# Patient Record
Sex: Female | Born: 1960 | Race: White | Hispanic: No | Marital: Married | Smoking: Current every day smoker
Health system: Southern US, Community
[De-identification: ages and names within clinical notes are randomized; demographics above are authoritative.]

## PROBLEM LIST (undated history)

## (undated) DIAGNOSIS — I4891 Unspecified atrial fibrillation: Secondary | ICD-10-CM

## (undated) HISTORY — PX: FOOT SURGERY: SHX648

---

## 2002-12-29 ENCOUNTER — Emergency Department (HOSPITAL_COMMUNITY): Admission: EM | Admit: 2002-12-29 | Discharge: 2002-12-29 | Payer: Self-pay | Admitting: Emergency Medicine

## 2007-02-23 ENCOUNTER — Ambulatory Visit: Payer: Self-pay

## 2007-02-28 ENCOUNTER — Ambulatory Visit: Payer: Self-pay

## 2007-03-06 ENCOUNTER — Ambulatory Visit: Payer: Self-pay

## 2008-10-28 ENCOUNTER — Emergency Department: Payer: Self-pay | Admitting: Emergency Medicine

## 2009-09-26 ENCOUNTER — Emergency Department: Payer: Self-pay | Admitting: Emergency Medicine

## 2010-01-21 ENCOUNTER — Ambulatory Visit: Payer: Self-pay | Admitting: Family Medicine

## 2012-07-10 ENCOUNTER — Emergency Department: Payer: Self-pay | Admitting: Emergency Medicine

## 2013-07-24 ENCOUNTER — Emergency Department: Payer: Self-pay | Admitting: Emergency Medicine

## 2014-12-26 ENCOUNTER — Emergency Department: Payer: Self-pay | Admitting: Student

## 2017-03-06 ENCOUNTER — Emergency Department: Payer: Self-pay

## 2017-03-06 ENCOUNTER — Emergency Department
Admission: EM | Admit: 2017-03-06 | Discharge: 2017-03-06 | Disposition: A | Discharge status: Home / Self Care | Payer: Self-pay | Attending: Emergency Medicine | Admitting: Emergency Medicine

## 2017-03-06 DIAGNOSIS — M79604 Pain in right leg: Secondary | ICD-10-CM | POA: Insufficient documentation

## 2017-03-06 DIAGNOSIS — R252 Cramp and spasm: Secondary | ICD-10-CM

## 2017-03-06 DIAGNOSIS — M79661 Pain in right lower leg: Secondary | ICD-10-CM

## 2017-03-06 LAB — COMPREHENSIVE METABOLIC PANEL
ALT: 16 U/L (ref 14–54)
AST: 26 U/L (ref 15–41)
Albumin: 3.9 g/dL (ref 3.5–5.0)
Alkaline Phosphatase: 79 U/L (ref 38–126)
Anion gap: 7 (ref 5–15)
BILIRUBIN TOTAL: 0.4 mg/dL (ref 0.3–1.2)
BUN: 8 mg/dL (ref 6–20)
CO2: 24 mmol/L (ref 22–32)
CREATININE: 0.64 mg/dL (ref 0.44–1.00)
Calcium: 8.6 mg/dL — ABNORMAL LOW (ref 8.9–10.3)
Chloride: 104 mmol/L (ref 101–111)
Glucose, Bld: 89 mg/dL (ref 65–99)
POTASSIUM: 3.5 mmol/L (ref 3.5–5.1)
Sodium: 135 mmol/L (ref 135–145)
TOTAL PROTEIN: 8.5 g/dL — AB (ref 6.5–8.1)

## 2017-03-06 LAB — CBC
HEMATOCRIT: 44.8 % (ref 35.0–47.0)
Hemoglobin: 15.3 g/dL (ref 12.0–16.0)
MCH: 29.5 pg (ref 26.0–34.0)
MCHC: 34.2 g/dL (ref 32.0–36.0)
MCV: 86.4 fL (ref 80.0–100.0)
Platelets: 311 10*3/uL (ref 150–440)
RBC: 5.18 MIL/uL (ref 3.80–5.20)
RDW: 13.5 % (ref 11.5–14.5)
WBC: 11.1 10*3/uL — ABNORMAL HIGH (ref 3.6–11.0)

## 2017-03-06 MED ORDER — CALCIUM CARBONATE ANTACID 500 MG PO CHEW
1.0000 | CHEWABLE_TABLET | Freq: Once | ORAL | Status: AC
  Administered 2017-03-06: 200 mg via ORAL
  Filled 2017-03-06: qty 1

## 2017-03-06 NOTE — ED Notes (Signed)

## 2017-03-06 NOTE — ED Provider Notes (Addendum)
[facility] Emergency Department Provider Note  ____________________________________________  Time seen: Approximately 10:00 PM  I have reviewed the triage vital signs and the nursing notes.   HISTORY  Chief Complaint Leg Pain    HPI Caroline Rowe is a 56 y.o. female with a history of recurrent lower extremity cramps presenting with right lower extremity cramps. The patient reports that since yesterday, she has had cramps E worse than her usual cramping terms of severity of pain. When she develops the right calf cramp, she walks around or massages it and this seems to help. The cramps are worse if she is sitting for a long time, and she had 2 episodes while sleeping at night. She has no personal or family history of blood clots, and has not had chest pain or shortness of breath. No trauma or fevers.   No past medical history on file.  There are no active problems to display for this patient.   No past surgical history on file.    Allergies Patient has no known allergies.  No family history on file.  Social History Social History  Substance Use Topics  . Smoking status: Not on file  . Smokeless tobacco: Not on file  . Alcohol use Not on file    Review of Systems Constitutional: No fever/chills. Eyes: No visual changes. ENT: No sore throat. No congestion or rhinorrhea. Cardiovascular: Denies chest pain. Denies palpitations. Respiratory: Denies shortness of breath.  No cough. Gastrointestinal:  No nausea, no vomiting.  No diarrhea.  No constipation. Musculoskeletal: Negative for back pain.Positive right lower extremity calf cramps. Skin: Negative for rash. Neurological: Negative for headaches. No focal numbness, tingling or weakness.   10-point ROS otherwise negative.  ____________________________________________   PHYSICAL EXAM:  VITAL SIGNS: ED Triage Vitals  Enc Vitals Group     BP 03/06/17 1935 138/78     Pulse Rate 03/06/17  1935 82     Resp 03/06/17 1935 20     Temp 03/06/17 1935 98.7 F (37.1 C)     Temp Source 03/06/17 1935 Oral     SpO2 03/06/17 1935 100 %     Weight 03/06/17 1935 300 lb (136.1 kg)     Height 03/06/17 1935 5\' 4"  (1.626 m)     Head Circumference --      Peak Flow --      Pain Score 03/06/17 1936 4     Pain Loc --      Pain Edu? --      Excl. in GC? --     Constitutional: Alert and oriented. Well appearing and in no acute distress. Answers questions appropriately. Eyes: Conjunctivae are normal.  EOMI. No scleral icterus. Head: Atraumatic. Nose: No congestion/rhinnorhea. Mouth/Throat: Mucous membranes are moist.  Neck: No stridor.  Supple.   Cardiovascular: Normal rate. Respiratory: Normal respiratory effort.  No accessory muscle use or retractions. Gastrointestinal: Obese. Soft, nontender and nondistended.  No guarding or rebound.  No peritoneal signs. Musculoskeletal: Morbidly obese legs bilaterally w/o pitting edema.  No palpable cords.  Neg Homan's sign.  No skin changes or evidence of trauma.  No active cramping on my examination. 5 out of 5 in quadriceps and hamstring strength, dorsiflexion and plantarflexion strength. Normal DP and PT pulse on the right. Normal sensation to light touch throughout the right lower extremity. Neurologic:  A&Ox3.  Speech is clear.  Face and smile are symmetric.  EOMI.  Moves all extremities well. Normal gait w/o ataxia. Skin:  Skin  is warm, dry and intact. No rash noted. Psychiatric: Mood and affect are normal. Speech and behavior are normal.  Normal judgement.  ____________________________________________   LABS (all labs ordered are listed, but only abnormal results are displayed)  Labs Reviewed  CBC - Abnormal; Notable for the following:       Result Value   WBC 11.1 (*)    All other components within normal limits  COMPREHENSIVE METABOLIC PANEL - Abnormal; Notable for the following:    Calcium 8.6 (*)    Total Protein 8.5 (*)    All  other components within normal limits   ____________________________________________  EKG  Not indicated ____________________________________________  RADIOLOGY  No results found.  ____________________________________________   PROCEDURES  Procedure(s) performed: None  Procedures  Critical Care performed: No ____________________________________________   INITIAL IMPRESSION / ASSESSMENT AND PLAN / ED COURSE  Pertinent labs & imaging results that were available during my care of the patient were reviewed by me and considered in my medical decision making (see chart for details).  56 y.o. female with a history of leg cramps presenting with worsened leg cramps. The patient's laboratory show normal potassium and sodium, and minimally decreased calcium at 8.6. I will supplement her calcium orally with Tums to see if this can help the patient's symptoms. I'm awaiting the results of her ultrasound for final disposition.  ----------------------------------------- 10:21 PM on 03/06/2017 -----------------------------------------  The patient's ultrasound shows an area that is hyperechoic in the right calf where there may be fluid. This could be a hematoma, and the radiologist states that this could be an infection. However, the patient is not having any infectious symptoms, and her white blood cell count is 11.1 which is nonspecific. I have personally reexamined the patient will she is standing and push on the calf, which is tender but has no palpable fluctuance or evidence of overlying skin infection. I have told the patient the results of her studies, and asked her to please monitor her calf or have her family members look at her calf twice daily, and follow up with her primary care physician in 2 days. She and her sister understand return precautions as well as follow-up instructions.  ____________________________________________  FINAL CLINICAL IMPRESSION(S) / ED DIAGNOSES  Final  diagnoses:  Leg cramps  Hypocalcemia         NEW MEDICATIONS STARTED DURING THIS VISIT:  New Prescriptions   No medications on file      Rockne MenghiniAnne-Caroline Tommy Minichiello, MD 03/06/17 2204    Rockne MenghiniAnne-Caroline Jolanta Cabeza, MD 03/06/17 2222

## 2017-03-06 NOTE — Discharge Instructions (Signed)
Please make a follow up with your primary care doctor to have a recheck of your calf, and to recheck your calcium level.  Return to the emergency department if you develop worsening pain, fever, vomiting, or any other symptoms concerning to you.

## 2017-03-06 NOTE — ED Notes (Signed)
Pt. States leg cramp to rt. Leg that started two days ago.  Pt. States hx of leg cramps to lt. Leg.  Pt. States leg cramp usually does not last this long.  Pt. Denies hx of clots.

## 2017-03-06 NOTE — ED Triage Notes (Signed)
Pt states that she frequently gets leg cramps, states that she has had a severe cramp to the rt lower leg and reports the pain continues to linger and radiate throughout her leg. Pt denies any known history of electrolyte issues, also denies hx of blood clots

## 2018-09-30 ENCOUNTER — Encounter: Payer: Self-pay | Admitting: Emergency Medicine

## 2018-09-30 ENCOUNTER — Inpatient Hospital Stay
Admission: EM | Admit: 2018-09-30 | Discharge: 2018-10-04 | DRG: 191 | Disposition: A | Discharge status: Home / Self Care | Payer: Self-pay | Attending: Internal Medicine | Admitting: Internal Medicine

## 2018-09-30 ENCOUNTER — Other Ambulatory Visit: Payer: Self-pay

## 2018-09-30 ENCOUNTER — Emergency Department: Payer: Self-pay

## 2018-09-30 DIAGNOSIS — J441 Chronic obstructive pulmonary disease with (acute) exacerbation: Principal | ICD-10-CM | POA: Diagnosis present

## 2018-09-30 DIAGNOSIS — E669 Obesity, unspecified: Secondary | ICD-10-CM | POA: Diagnosis present

## 2018-09-30 DIAGNOSIS — R001 Bradycardia, unspecified: Secondary | ICD-10-CM | POA: Diagnosis not present

## 2018-09-30 DIAGNOSIS — R002 Palpitations: Secondary | ICD-10-CM | POA: Diagnosis present

## 2018-09-30 DIAGNOSIS — I4891 Unspecified atrial fibrillation: Secondary | ICD-10-CM | POA: Diagnosis present

## 2018-09-30 DIAGNOSIS — J209 Acute bronchitis, unspecified: Secondary | ICD-10-CM | POA: Diagnosis present

## 2018-09-30 DIAGNOSIS — J440 Chronic obstructive pulmonary disease with acute lower respiratory infection: Secondary | ICD-10-CM | POA: Diagnosis present

## 2018-09-30 DIAGNOSIS — Z6841 Body Mass Index (BMI) 40.0 and over, adult: Secondary | ICD-10-CM

## 2018-09-30 DIAGNOSIS — I5022 Chronic systolic (congestive) heart failure: Secondary | ICD-10-CM | POA: Diagnosis present

## 2018-09-30 DIAGNOSIS — E119 Type 2 diabetes mellitus without complications: Secondary | ICD-10-CM | POA: Diagnosis present

## 2018-09-30 DIAGNOSIS — Z8249 Family history of ischemic heart disease and other diseases of the circulatory system: Secondary | ICD-10-CM

## 2018-09-30 DIAGNOSIS — F17200 Nicotine dependence, unspecified, uncomplicated: Secondary | ICD-10-CM | POA: Diagnosis present

## 2018-09-30 LAB — CBC WITH DIFFERENTIAL/PLATELET
ABS IMMATURE GRANULOCYTES: 0.02 10*3/uL (ref 0.00–0.07)
Basophils Absolute: 0 10*3/uL (ref 0.0–0.1)
Basophils Relative: 1 %
Eosinophils Absolute: 0.1 10*3/uL (ref 0.0–0.5)
Eosinophils Relative: 1 %
HEMATOCRIT: 46.1 % — AB (ref 36.0–46.0)
Hemoglobin: 14.8 g/dL (ref 12.0–15.0)
IMMATURE GRANULOCYTES: 0 %
LYMPHS ABS: 2.1 10*3/uL (ref 0.7–4.0)
Lymphocytes Relative: 23 %
MCH: 29.4 pg (ref 26.0–34.0)
MCHC: 32.1 g/dL (ref 30.0–36.0)
MCV: 91.5 fL (ref 80.0–100.0)
MONO ABS: 0.6 10*3/uL (ref 0.1–1.0)
MONOS PCT: 7 %
NEUTROS ABS: 6 10*3/uL (ref 1.7–7.7)
NEUTROS PCT: 68 %
Platelets: 277 10*3/uL (ref 150–400)
RBC: 5.04 MIL/uL (ref 3.87–5.11)
RDW: 13.8 % (ref 11.5–15.5)
WBC: 8.8 10*3/uL (ref 4.0–10.5)
nRBC: 0 % (ref 0.0–0.2)

## 2018-09-30 LAB — COMPREHENSIVE METABOLIC PANEL
ALK PHOS: 69 U/L (ref 38–126)
ALT: 14 U/L (ref 0–44)
AST: 21 U/L (ref 15–41)
Albumin: 3.6 g/dL (ref 3.5–5.0)
Anion gap: 10 (ref 5–15)
BILIRUBIN TOTAL: 1.1 mg/dL (ref 0.3–1.2)
BUN: 9 mg/dL (ref 6–20)
CALCIUM: 8.8 mg/dL — AB (ref 8.9–10.3)
CO2: 25 mmol/L (ref 22–32)
CREATININE: 0.73 mg/dL (ref 0.44–1.00)
Chloride: 105 mmol/L (ref 98–111)
GFR calc Af Amer: >60 mL/min (ref >60)
Glucose, Bld: 125 mg/dL — ABNORMAL HIGH (ref 70–99)
Potassium: 4.4 mmol/L (ref 3.5–5.1)
Sodium: 140 mmol/L (ref 135–145)
TOTAL PROTEIN: 7.8 g/dL (ref 6.5–8.1)

## 2018-09-30 LAB — BRAIN NATRIURETIC PEPTIDE: B Natriuretic Peptide: 525 pg/mL — ABNORMAL HIGH (ref 0.0–100.0)

## 2018-09-30 LAB — TROPONIN I: Troponin I: <0.03 ng/mL (ref <0.03)

## 2018-09-30 MED ORDER — ACETAMINOPHEN 325 MG PO TABS: 650.0000 mg | ORAL_TABLET | ORAL | Status: DC | PRN

## 2018-09-30 MED ORDER — AMOXICILLIN-POT CLAVULANATE 875-125 MG PO TABS
1.0000 | ORAL_TABLET | Freq: Two times a day (BID) | ORAL | Status: DC
  Administered 2018-09-30 – 2018-10-04 (×9): 1 via ORAL
  Filled 2018-09-30 (×9): qty 1

## 2018-09-30 MED ORDER — METHYLPREDNISOLONE SODIUM SUCC 125 MG IJ SOLR
125.0000 mg | Freq: Once | INTRAMUSCULAR | Status: AC
  Administered 2018-09-30: 125 mg via INTRAVENOUS

## 2018-09-30 MED ORDER — MAGNESIUM SULFATE 2 GM/50ML IV SOLN
INTRAVENOUS | Status: AC
  Filled 2018-09-30: qty 50

## 2018-09-30 MED ORDER — DILTIAZEM HCL-DEXTROSE 100-5 MG/100ML-% IV SOLN (PREMIX)
5.0000 mg/h | Freq: Once | INTRAVENOUS | Status: DC
  Filled 2018-09-30: qty 100

## 2018-09-30 MED ORDER — ENOXAPARIN SODIUM 120 MG/0.8ML ~~LOC~~ SOLN: 120.0000 mg | Freq: Two times a day (BID) | SUBCUTANEOUS | Status: DC

## 2018-09-30 MED ORDER — FUROSEMIDE 10 MG/ML IJ SOLN
40.0000 mg | Freq: Once | INTRAMUSCULAR | Status: AC
  Administered 2018-09-30: 40 mg via INTRAVENOUS
  Filled 2018-09-30: qty 4

## 2018-09-30 MED ORDER — ENOXAPARIN SODIUM 120 MG/0.8ML ~~LOC~~ SOLN
120.0000 mg | Freq: Two times a day (BID) | SUBCUTANEOUS | Status: DC
  Administered 2018-09-30 – 2018-10-02 (×4): 120 mg via SUBCUTANEOUS
  Filled 2018-09-30 (×6): qty 0.8

## 2018-09-30 MED ORDER — NICOTINE 21 MG/24HR TD PT24
21.0000 mg | MEDICATED_PATCH | Freq: Every day | TRANSDERMAL | Status: DC
  Administered 2018-09-30 – 2018-10-04 (×5): 21 mg via TRANSDERMAL
  Filled 2018-09-30 (×5): qty 1

## 2018-09-30 MED ORDER — SODIUM CHLORIDE 0.9% FLUSH: 3.0000 mL | INTRAVENOUS | Status: DC | PRN

## 2018-09-30 MED ORDER — ALBUTEROL SULFATE (2.5 MG/3ML) 0.083% IN NEBU
5.0000 mg | INHALATION_SOLUTION | Freq: Once | RESPIRATORY_TRACT | Status: AC
  Administered 2018-09-30: 5 mg via RESPIRATORY_TRACT
  Filled 2018-09-30: qty 6

## 2018-09-30 MED ORDER — IPRATROPIUM-ALBUTEROL 0.5-2.5 (3) MG/3ML IN SOLN
3.0000 mL | Freq: Once | RESPIRATORY_TRACT | Status: AC
  Administered 2018-09-30: 3 mL via RESPIRATORY_TRACT

## 2018-09-30 MED ORDER — ONDANSETRON HCL 4 MG/2ML IJ SOLN: 4.0000 mg | Freq: Four times a day (QID) | INTRAMUSCULAR | Status: DC | PRN

## 2018-09-30 MED ORDER — DILTIAZEM HCL-DEXTROSE 100-5 MG/100ML-% IV SOLN (PREMIX)
5.0000 mg/h | INTRAVENOUS | Status: DC
  Administered 2018-09-30 – 2018-10-01 (×3): 5 mg/h via INTRAVENOUS
  Filled 2018-09-30 (×2): qty 100

## 2018-09-30 MED ORDER — IPRATROPIUM-ALBUTEROL 0.5-2.5 (3) MG/3ML IN SOLN
RESPIRATORY_TRACT | Status: AC
  Filled 2018-09-30: qty 6

## 2018-09-30 MED ORDER — SODIUM CHLORIDE 0.9 % IV BOLUS
500.0000 mL | Freq: Once | INTRAVENOUS | Status: AC
  Administered 2018-09-30: 500 mL via INTRAVENOUS

## 2018-09-30 MED ORDER — METHYLPREDNISOLONE SODIUM SUCC 125 MG IJ SOLR
60.0000 mg | Freq: Four times a day (QID) | INTRAMUSCULAR | Status: DC
  Administered 2018-09-30 – 2018-10-04 (×15): 60 mg via INTRAVENOUS
  Filled 2018-09-30 (×15): qty 2

## 2018-09-30 MED ORDER — METHYLPREDNISOLONE SODIUM SUCC 125 MG IJ SOLR
INTRAMUSCULAR | Status: AC
  Administered 2018-09-30: 125 mg
  Filled 2018-09-30: qty 2

## 2018-09-30 MED ORDER — MAGNESIUM SULFATE 2 GM/50ML IV SOLN
2.0000 g | Freq: Once | INTRAVENOUS | Status: AC
  Administered 2018-09-30: 2 g via INTRAVENOUS

## 2018-09-30 MED ORDER — DILTIAZEM HCL 60 MG PO TABS
60.0000 mg | ORAL_TABLET | Freq: Once | ORAL | Status: AC
  Administered 2018-09-30: 60 mg via ORAL
  Filled 2018-09-30: qty 1

## 2018-09-30 MED ORDER — IPRATROPIUM-ALBUTEROL 0.5-2.5 (3) MG/3ML IN SOLN
3.0000 mL | Freq: Four times a day (QID) | RESPIRATORY_TRACT | Status: DC
  Administered 2018-09-30 – 2018-10-04 (×15): 3 mL via RESPIRATORY_TRACT
  Filled 2018-09-30 (×18): qty 3

## 2018-09-30 MED ORDER — ASPIRIN EC 81 MG PO TBEC
81.0000 mg | DELAYED_RELEASE_TABLET | Freq: Every day | ORAL | Status: DC
  Administered 2018-09-30 – 2018-10-04 (×5): 81 mg via ORAL
  Filled 2018-09-30 (×5): qty 1

## 2018-09-30 MED ORDER — SODIUM CHLORIDE 0.9% FLUSH
3.0000 mL | Freq: Two times a day (BID) | INTRAVENOUS | Status: DC
  Administered 2018-09-30 – 2018-10-04 (×7): 3 mL via INTRAVENOUS

## 2018-09-30 MED ORDER — GUAIFENESIN-DM 100-10 MG/5ML PO SYRP
5.0000 mL | ORAL_SOLUTION | ORAL | Status: DC | PRN
  Administered 2018-09-30: 5 mL via ORAL
  Filled 2018-09-30: qty 5

## 2018-09-30 MED ORDER — DILTIAZEM HCL 25 MG/5ML IV SOLN
15.0000 mg | Freq: Once | INTRAVENOUS | Status: AC
  Administered 2018-09-30: 15 mg via INTRAVENOUS
  Filled 2018-09-30: qty 5

## 2018-09-30 MED ORDER — SODIUM CHLORIDE 0.9 % IV SOLN
250.0000 mL | INTRAVENOUS | Status: DC | PRN
  Administered 2018-10-03: 12:00:00 via INTRAVENOUS

## 2018-09-30 NOTE — ED Notes (Signed)
Pt ambulatory to toilet with steady gait noted. No assistance.

## 2018-09-30 NOTE — ED Triage Notes (Signed)
Here for [facility]. Has had cough X 6 months and been treated for bronchitis in past.  Last week has had increased WOB. Denies COPD but is smoker.  Pursed lip breathing in triage noted.  + cough. Subjective fever

## 2018-09-30 NOTE — Progress Notes (Addendum)
ANTICOAGULATION CONSULT NOTE  Pharmacy Consult for enoxaparin Indication: atrial fibrillation  No Known Allergies  Patient Measurements: Height: 5\' 6"  (167.6 cm) Weight: 300 lb (136.1 kg) IBW/kg (Calculated) : 59.3   Vital Signs: Temp: 98.8 F (37.1 C) (10/19 0902) BP: 129/93 (10/19 1130) Pulse Rate: 100 (10/19 1148)  Labs: Recent Labs    09/30/18 0921  HGB 14.8  HCT 46.1*  PLT 277  CREATININE 0.73  TROPONINI <0.03    Estimated Creatinine Clearance: 110.2 mL/min (by C-G formula based on SCr of 0.73 mg/dL).   Medical History: History reviewed. No pertinent past medical history.   Assessment: 57 year old female with new onset atrial fibrillation. Pharmacy consulted for dosing of enoxaparin. Patient on no anticoagulation PTA.  Goal of Therapy:  Monitor platelets by anticoagulation protocol: Yes   Plan:  Will start enoxaparin 1 mg/kg twice daily. Will round to closest available dose of 120 mg. Enoxaparin 120 mg twice daily to start today at 1400.  Pricilla Riffle, PharmD Pharmacy Resident  09/30/2018 12:06 PM

## 2018-09-30 NOTE — Progress Notes (Signed)
Advanced care plan.  Purpose of the Encounter: CODE STATUS  Parties in Attendance: Patient  Patient's Decision Capacity: Good  Subjective/Patient's story: Presented to emergency room for cough, shortness of breath and wheezing   Objective/Medical story Patient has COPD new onset and bronchitis Also has atrial fibrillation with rapid rate Needs nebulization treatments and IV Cardizem drip for rate control   Goals of care determination:  Advance care directives and goals of care discussed Patient wants everything done which includes CPR, intubation ventilator if the need arises   CODE STATUS: Full code   Time spent discussing advanced care planning: 16 minutes

## 2018-09-30 NOTE — ED Provider Notes (Signed)
[facility] Emergency Department Provider Note    None    (approximate)  I have reviewed the triage vital signs and the nursing notes.   HISTORY  Chief Complaint Shortness of Breath    HPI Caroline Rowe is a 57 y.o. female history of smoking presents with cough for 6 months previously treated for bronchitis without any improvement presents the ER with worsening shortness of breath and exertional dyspnea.  Denies any pain but is having clear productive phlegm.  Denies any fevers.  Does feel short of breath even at rest.  Denies any lower extremity swelling.  No history of DVT or PE.  Denies any history of heart disease or dysrhythmia.  Does have a family history of heart disease.  Denies any nausea or vomiting.    History reviewed. No pertinent past medical history. Family History  Problem Relation Age of Onset  . Heart disease Father    Past Surgical History:  Procedure Laterality Date  . FOOT SURGERY     Patient Active Problem List   Diagnosis Date Noted  . A-fib (HCC) 09/30/2018      Prior to Admission medications   Medication Sig Start Date End Date Taking? Authorizing Provider  ibuprofen (ADVIL,MOTRIN) 200 MG tablet Take 400-800 mg by mouth every 6 (six) hours as needed for mild pain.   Yes [provider]    Allergies Patient has no known allergies.    Social History Social History   Tobacco Use  . Smoking status: Current Every Day Smoker  . Smokeless tobacco: Never Used  Substance Use Topics  . Alcohol use: Never    Frequency: Never  . Drug use: Never    Review of Systems Patient denies headaches, rhinorrhea, blurry vision, numbness, shortness of breath, chest pain, edema, cough, abdominal pain, nausea, vomiting, diarrhea, dysuria, fevers, rashes or hallucinations unless otherwise stated above in HPI. ____________________________________________   PHYSICAL EXAM:  VITAL SIGNS: Vitals:   09/30/18 1148 09/30/18  1321  BP:    Pulse: 100   Resp: (!) 21 15  Temp:    SpO2: 93%     Constitutional: Alert and oriented.  Eyes: Conjunctivae are normal.  Head: Atraumatic. Nose: No congestion/rhinnorhea. Mouth/Throat: Mucous membranes are moist.   Neck: No stridor. Painless ROM.  Cardiovascular: tachycardic, irregularly, irregular rhythm. Grossly normal heart sounds.  Good peripheral circulation. Respiratory: tachypnea, prolonged expiratory phase, diffuse rhonchi in anterior lungs fields and coarse wheeze Gastrointestinal: Soft and nontender. No distention. No abdominal bruits. No CVA tenderness. Genitourinary:  Musculoskeletal: No lower extremity tenderness nor edema.  No joint effusions. Neurologic:  Normal speech and language. No gross focal neurologic deficits are appreciated. No facial droop Skin:  Skin is warm, dry and intact. No rash noted. Psychiatric: Mood and affect are normal. Speech and behavior are normal.  ____________________________________________   LABS (all labs ordered are listed, but only abnormal results are displayed)  Results for orders placed or performed during the hospital encounter of 09/30/18 (from the past 24 hour(s))  CBC with Differential/Platelet     Status: Abnormal   Collection Time: 09/30/18  9:21 AM  Result Value Ref Range   WBC 8.8 4.0 - 10.5 K/uL   RBC 5.04 3.87 - 5.11 MIL/uL   Hemoglobin 14.8 12.0 - 15.0 g/dL   HCT 16.1 (H) 09.6 - 04.5 %   MCV 91.5 80.0 - 100.0 fL   MCH 29.4 26.0 - 34.0 pg   MCHC 32.1 30.0 - 36.0 g/dL  RDW 13.8 11.5 - 15.5 %   Platelets 277 150 - 400 K/uL   nRBC 0.0 0.0 - 0.2 %   Neutrophils Relative % 68 %   Neutro Abs 6.0 1.7 - 7.7 K/uL   Lymphocytes Relative 23 %   Lymphs Abs 2.1 0.7 - 4.0 K/uL   Monocytes Relative 7 %   Monocytes Absolute 0.6 0.1 - 1.0 K/uL   Eosinophils Relative 1 %   Eosinophils Absolute 0.1 0.0 - 0.5 K/uL   Basophils Relative 1 %   Basophils Absolute 0.0 0.0 - 0.1 K/uL   Immature Granulocytes 0 %   Abs  Immature Granulocytes 0.02 0.00 - 0.07 K/uL  Comprehensive metabolic panel     Status: Abnormal   Collection Time: 09/30/18  9:21 AM  Result Value Ref Range   Sodium 140 135 - 145 mmol/L   Potassium 4.4 3.5 - 5.1 mmol/L   Chloride 105 98 - 111 mmol/L   CO2 25 22 - 32 mmol/L   Glucose, Bld 125 (H) 70 - 99 mg/dL   BUN 9 6 - 20 mg/dL   Creatinine, Ser 1.19 0.44 - 1.00 mg/dL   Calcium 8.8 (L) 8.9 - 10.3 mg/dL   Total Protein 7.8 6.5 - 8.1 g/dL   Albumin 3.6 3.5 - 5.0 g/dL   AST 21 15 - 41 U/L   ALT 14 0 - 44 U/L   Alkaline Phosphatase 69 38 - 126 U/L   Total Bilirubin 1.1 0.3 - 1.2 mg/dL   GFR calc non Af Amer >60 >60 mL/min   GFR calc Af Amer >60 >60 mL/min   Anion gap 10 5 - 15  Troponin I     Status: None   Collection Time: 09/30/18  9:21 AM  Result Value Ref Range   Troponin I <0.03 <0.03 ng/mL  Brain natriuretic peptide     Status: Abnormal   Collection Time: 09/30/18  9:21 AM  Result Value Ref Range   B Natriuretic Peptide 525.0 (H) 0.0 - 100.0 pg/mL   ____________________________________________  EKG My review and personal interpretation at Time: 9:02   Indication: sob  Rate: 150  Rhythm: afib with rvr Axis: normal Other: nonspecific st abn, nos temi ____________________________________________  RADIOLOGY  I personally reviewed all radiographic images ordered to evaluate for the above acute complaints and reviewed radiology reports and findings.  These findings were personally discussed with the patient.  Please see medical record for radiology report.  ____________________________________________   PROCEDURES  Procedure(s) performed:  .Critical Care Performed by: Willy Eddy, MD Authorized by: Willy Eddy, MD   Critical care provider statement:    Critical care time (minutes):  43   Critical care time was exclusive of:  Separately billable procedures and treating other patients   Critical care was necessary to treat or prevent imminent or  life-threatening deterioration of the following conditions:  Cardiac failure   Critical care was time spent personally by me on the following activities:  Development of treatment plan with patient or surrogate, discussions with consultants, evaluation of patient's response to treatment, examination of patient, obtaining history from patient or surrogate, ordering and performing treatments and interventions, ordering and review of laboratory studies, ordering and review of radiographic studies, pulse oximetry, re-evaluation of patient's condition and review of old charts      Critical Care performed: yes ____________________________________________   INITIAL IMPRESSION / ASSESSMENT AND PLAN / ED COURSE  Pertinent labs & imaging results that were available during my care of the  patient were reviewed by me and considered in my medical decision making (see chart for details).   DDX: Asthma, copd, CHF, pna, ptx, malignancy, Pe, anemia   Ghislaine C Berghuis is a 57 y.o. who presents to the ED with was as described above.  Patient is tachycardic but normotensive.  Does show evidence of A. fib with RVR.  Presentation is concerning for COPD exacerbation with acute bronchitis versus CHF.  Blood will be sent for the above differential.  Will give small bolus of IV fluids as well as nebulizers, IV steroids as well as IV magnesium and reassess.  Clinical Course as of Sep 30 1341  Sat Sep 30, 2018  1027 Patient with persistent tachycardia.  Chest x-ray does show evidence of bony vascular congestion, elevated BNP concerning for new onset congestive heart failure.  Patient with persistent A. fib with RVR we will give IV Cardizem bolus followed by infusion.  Some improvement in respiration after nebulizer treatment.   [PR]  1107 Still persistent wheeze as well.  Will give additional nebulizer treatments.  Patient will require hospitalization for additional respiratory therapy as well as hemodynamic monitoring  concern for A. fib with RVR and chest x-ray concerning for pulmonary edema as she would benefit from echo and further evaluation.  Have discussed with the patient and available family all diagnostics and treatments performed thus far and all questions were answered to the best of my ability. The patient demonstrates understanding and agreement with plan.    [PR]    Clinical Course User Index [PR] Willy Eddy, MD     As part of my medical decision making, I reviewed the following data within the electronic MEDICAL RECORD NUMBER Nursing notes reviewed and incorporated, Labs reviewed, notes from prior ED visits and [place] Controlled Substance Database   ____________________________________________   FINAL CLINICAL IMPRESSION(S) / ED DIAGNOSES  Final diagnoses:  COPD with acute exacerbation (HCC)  Atrial fibrillation with RVR (HCC)      NEW MEDICATIONS STARTED DURING THIS VISIT:  New Prescriptions   No medications on file     Note:  This document was prepared using Dragon voice recognition software and may include unintentional dictation errors.    Willy Eddy, MD 09/30/18 [phone]

## 2018-09-30 NOTE — H&P (Signed)
[place] [facility] [place] NAME: Caroline Rowe    MR#:  [id number]  DATE OF BIRTH:  14-May-1961  DATE OF ADMISSION:  09/30/2018  PRIMARY CARE PHYSICIAN: Patient, No Pcp Per   REQUESTING/REFERRING PHYSICIAN:   CHIEF COMPLAINT:   Chief Complaint  Patient presents with  . Shortness of Breath    HISTORY OF PRESENT ILLNESS: Caroline Rowe  is a 57 y.o. female with no significant past medical history presented to emergency room for cough and wheezing.  Cough has been going on for the last couple of weeks.  She is an active smoker.  Patient also had some palpitations since 1 day.  She was evaluated in the emergency room was found to be in atrial fibrillation with rapid rate with a rate of 150 bpm.  Patient was started on IV Cardizem drip in the emergency room.  She received a nebulization treatment and Solu-Medrol for COPD flare.  No complaints of any chest pain.  Hospitalist service was consulted for further care.  PAST MEDICAL HISTORY: None  PAST SURGICAL HISTORY: None Past Surgical History:  Procedure Laterality Date  . FOOT SURGERY      SOCIAL HISTORY:  Social History   Tobacco Use  . Smoking status: Current Every Day Smoker  . Smokeless tobacco: Never Used  Substance Use Topics  . Alcohol use: Never    Frequency: Never    FAMILY HISTORY:  Family History  Problem Relation Age of Onset  . Heart disease Father     DRUG ALLERGIES: No Known Allergies  REVIEW OF SYSTEMS:   CONSTITUTIONAL: No fever, fatigue or weakness.  EYES: No blurred or double vision.  EARS, NOSE, AND THROAT: No tinnitus or ear pain.  RESPIRATORY: Has cough, shortness of breath, wheezing  No hemoptysis.  CARDIOVASCULAR: No chest pain, orthopnea, edema.  Palpitations GASTROINTESTINAL: No nausea, vomiting, diarrhea or abdominal pain.  GENITOURINARY: No dysuria, hematuria.  ENDOCRINE: No polyuria, nocturia,  HEMATOLOGY: No anemia, easy bruising or  bleeding SKIN: No rash or lesion. MUSCULOSKELETAL: No joint pain or arthritis.   NEUROLOGIC: No tingling, numbness, weakness.  PSYCHIATRY: No anxiety or depression.   MEDICATIONS AT HOME:  Prior to Admission medications   Medication Sig Start Date End Date Taking? Authorizing Provider  ibuprofen (ADVIL,MOTRIN) 200 MG tablet Take 400-800 mg by mouth every 6 (six) hours as needed for mild pain.   Yes [provider]      PHYSICAL EXAMINATION:   VITAL SIGNS: Blood pressure (!) 124/97, pulse (!) 165, temperature 98.8 F (37.1 C), resp. rate 16, height '5\' 6"'  (1.676 m), weight 136.1 kg, SpO2 97 %.  GENERAL:  57 y.o.-year-old patient lying in the bed with no acute distress.  EYES: Pupils equal, round, reactive to light and accommodation. No scleral icterus. Extraocular muscles intact.  HEENT: Head atraumatic, normocephalic. Oropharynx and nasopharynx clear.  NECK:  Supple, no jugular venous distention. No thyroid enlargement, no tenderness.  LUNGS: Decreased breath sounds bilaterally, bilateral wheezing. No use of accessory muscles of respiration.  CARDIOVASCULAR: S1, S2 irregular. No murmurs, rubs, or gallops.  ABDOMEN: Soft, nontender, nondistended. Bowel sounds present. No organomegaly or mass.  EXTREMITIES: No pedal edema, cyanosis, or clubbing.  NEUROLOGIC: Cranial nerves II through XII are intact. Muscle strength 5/5 in all extremities. Sensation intact. Gait not checked.  PSYCHIATRIC: The patient is alert and oriented x 3.  SKIN: No obvious rash, lesion, or ulcer.   LABORATORY PANEL:   CBC Recent Labs  Lab 09/30/18 0921  WBC 8.8  HGB 14.8  HCT 46.1*  PLT 277  MCV 91.5  MCH 29.4  MCHC 32.1  RDW 13.8  LYMPHSABS 2.1  MONOABS 0.6  EOSABS 0.1  BASOSABS 0.0   ------------------------------------------------------------------------------------------------------------------  Chemistries  Recent Labs  Lab 09/30/18 0921  NA 140  K 4.4  CL 105  CO2 25  GLUCOSE  125*  BUN 9  CREATININE 0.73  CALCIUM 8.8*  AST 21  ALT 14  ALKPHOS 69  BILITOT 1.1   ------------------------------------------------------------------------------------------------------------------ estimated creatinine clearance is 110.2 mL/min (by C-G formula based on SCr of 0.73 mg/dL). ------------------------------------------------------------------------------------------------------------------ No results for input(s): TSH, T4TOTAL, T3FREE, THYROIDAB in the last 72 hours.  Invalid input(s): FREET3   Coagulation profile No results for input(s): INR, PROTIME in the last 168 hours. ------------------------------------------------------------------------------------------------------------------- No results for input(s): DDIMER in the last 72 hours. -------------------------------------------------------------------------------------------------------------------  Cardiac Enzymes Recent Labs  Lab 09/30/18 0921  TROPONINI <0.03   ------------------------------------------------------------------------------------------------------------------ Invalid input(s): POCBNP  ---------------------------------------------------------------------------------------------------------------  Urinalysis No results found for: COLORURINE, APPEARANCEUR, LABSPEC, PHURINE, GLUCOSEU, HGBUR, BILIRUBINUR, KETONESUR, PROTEINUR, UROBILINOGEN, NITRITE, LEUKOCYTESUR   RADIOLOGY: Dg Chest 2 View  Result Date: 09/30/2018 CLINICAL DATA:  Shortness of breath.  Cough. EXAM: CHEST - 2 VIEW COMPARISON:  None. FINDINGS: Central vascular structures are prominent and appear enlarged. Interstitial lung markings are mildly prominent. The trachea is midline. No large pleural effusions. Negative for a pneumothorax. No acute bone abnormality. Heart size is within normal limits. IMPRESSION: Prominent central vascular and interstitial lung markings. Findings are concerning for vascular congestion or mild  pulmonary edema. Electronically Signed   By: Markus Daft M.D.   On: 09/30/2018 10:24    EKG: Orders placed or performed during the hospital encounter of 09/30/18  . EKG 12-Lead  . EKG 12-Lead  . ED EKG  . ED EKG    IMPRESSION AND PLAN:  57 year old female patient with active tobacco abuse presented to the emergency room for cough, wheezing and palpitation  -New onset COPD with flare Start patient on IV Solu-Medrol 60 MDQ 6 hourly with ablation treatments  -Acute bronchitis Oral Augmentin antibiotic  -New onset atrial fibrillation IV Cardizem drip for rate control and switching to oral medication once parameters met Check echocardiogram and cardiology consultation  -DVT prophylaxis On anticoagulation with subcu Lovenox  -Tobacco abuse Tobacco cessation counseled to the patient for 6 minutes Nicotine patch offered  All the records are reviewed and case discussed with ED provider. Management plans discussed with the patient, family and they are in agreement.  CODE STATUS: Full code    TOTAL TIME TAKING CARE OF THIS PATIENT: 51 minutes.    Saundra Shelling M.D on 09/30/2018 at 11:33 AM  Between 7am to 6pm - Pager - [phone]  After 6pm go to [email or web address] - password [email or web address] [facility]  Office  [phone]  CC: Primary care physician; Patient, No Pcp Per

## 2018-10-01 ENCOUNTER — Other Ambulatory Visit: Payer: Self-pay

## 2018-10-01 ENCOUNTER — Inpatient Hospital Stay
Admit: 2018-10-01 | Discharge: 2018-10-01 | Disposition: A | Discharge status: Home / Self Care | Payer: Self-pay | Attending: Cardiovascular Disease | Admitting: Cardiovascular Disease

## 2018-10-01 LAB — ECHOCARDIOGRAM COMPLETE
Height: 66 in
WEIGHTICAEL: 4768 [oz_av]

## 2018-10-01 LAB — TROPONIN I: Troponin I: <0.03 ng/mL (ref <0.03)

## 2018-10-01 MED ORDER — MAGNESIUM HYDROXIDE 400 MG/5ML PO SUSP: 30.0000 mL | Freq: Every day | ORAL | Status: DC | PRN

## 2018-10-01 MED ORDER — AMIODARONE HCL 200 MG PO TABS
400.0000 mg | ORAL_TABLET | Freq: Every day | ORAL | Status: DC
  Administered 2018-10-01: 400 mg via ORAL
  Filled 2018-10-01: qty 2

## 2018-10-01 MED ORDER — AMIODARONE HCL IN DEXTROSE 360-4.14 MG/200ML-% IV SOLN
60.0000 mg/h | INTRAVENOUS | Status: DC
  Administered 2018-10-01: 60 mg/h via INTRAVENOUS
  Filled 2018-10-01: qty 200

## 2018-10-01 MED ORDER — AMIODARONE HCL IN DEXTROSE 360-4.14 MG/200ML-% IV SOLN
30.0000 mg/h | INTRAVENOUS | Status: DC
  Administered 2018-10-02 – 2018-10-04 (×5): 30 mg/h via INTRAVENOUS
  Filled 2018-10-01 (×5): qty 200

## 2018-10-01 NOTE — Progress Notes (Signed)
Remains in afib and will stop po amiodrone and  Iv cardizem and start IV amiodrone as LVEF severely depressed. Will start later entresto/coreg.

## 2018-10-01 NOTE — Progress Notes (Signed)
[facility] [place] [facility]   PATIENT NAME: Caroline Rowe    MR#:  [id number]  DATE OF BIRTH:  09/13/1961  SUBJECTIVE:  CHIEF COMPLAINT:   Chief Complaint  Patient presents with  . Shortness of Breath   Continues to have shortness of breath, wheezing and cough.  Telemetry shows atrial fibrillation  REVIEW OF SYSTEMS:    Review of Systems  Constitutional: Positive for malaise/fatigue. Negative for chills and fever.  HENT: Negative for sore throat.   Eyes: Negative for blurred vision, double vision and pain.  Respiratory: Positive for cough, shortness of breath and wheezing. Negative for hemoptysis.   Cardiovascular: Negative for chest pain, palpitations, orthopnea and leg swelling.  Gastrointestinal: Negative for abdominal pain, constipation, diarrhea, heartburn, nausea and vomiting.  Genitourinary: Negative for dysuria and hematuria.  Musculoskeletal: Negative for back pain and joint pain.  Skin: Negative for rash.  Neurological: Negative for sensory change, speech change, focal weakness and headaches.  Endo/Heme/Allergies: Does not bruise/bleed easily.  Psychiatric/Behavioral: Negative for depression. The patient is not nervous/anxious.     DRUG ALLERGIES:  No Known Allergies  VITALS:  Blood pressure 99/68, pulse 92, temperature 97.7 F (36.5 C), temperature source Oral, resp. rate 18, height 5\' 6"  (1.676 m), weight 135.2 kg, SpO2 96 %.  PHYSICAL EXAMINATION:   Physical Exam  GENERAL:  57 y.o.-year-old patient lying in the bed with conversational dyspnea.  Obese EYES: Pupils equal, round, reactive to light and accommodation. No scleral icterus. Extraocular muscles intact.  HEENT: Head atraumatic, normocephalic. Oropharynx and nasopharynx clear.  NECK:  Supple, no jugular venous distention. No thyroid enlargement, no tenderness.  LUNGS: Increased work of breathing with bilateral wheezing CARDIOVASCULAR: Tachycardic, irregularly  irregular ABDOMEN: Soft, nontender, nondistended. Bowel sounds present. No organomegaly or mass.  EXTREMITIES: No cyanosis, clubbing or edema b/l.    NEUROLOGIC: Cranial nerves II through XII are intact. No focal Motor or sensory deficits b/l.   PSYCHIATRIC: The patient is alert and oriented x 3.  SKIN: No obvious rash, lesion, or ulcer.   LABORATORY PANEL:   CBC Recent Labs  Lab 09/30/18 0921  WBC 8.8  HGB 14.8  HCT 46.1*  PLT 277   ------------------------------------------------------------------------------------------------------------------ Chemistries  Recent Labs  Lab 09/30/18 0921  NA 140  K 4.4  CL 105  CO2 25  GLUCOSE 125*  BUN 9  CREATININE 0.73  CALCIUM 8.8*  AST 21  ALT 14  ALKPHOS 69  BILITOT 1.1   ------------------------------------------------------------------------------------------------------------------  Cardiac Enzymes Recent Labs  Lab 10/01/18 0509  TROPONINI <0.03   ------------------------------------------------------------------------------------------------------------------  RADIOLOGY:  Dg Chest 2 View  Result Date: 09/30/2018 CLINICAL DATA:  Shortness of breath.  Cough. EXAM: CHEST - 2 VIEW COMPARISON:  None. FINDINGS: Central vascular structures are prominent and appear enlarged. Interstitial lung markings are mildly prominent. The trachea is midline. No large pleural effusions. Negative for a pneumothorax. No acute bone abnormality. Heart size is within normal limits. IMPRESSION: Prominent central vascular and interstitial lung markings. Findings are concerning for vascular congestion or mild pulmonary edema. Electronically Signed   By: Richarda Overlie M.D.   On: 09/30/2018 10:24     ASSESSMENT AND PLAN:   57 year old female patient with active tobacco abuse presented to the emergency room for cough, wheezing and palpitation  * Acute bronchitis -IV steroids, Antibiotics - Scheduled Nebulizers - Inhalers -Wean O2 as  tolerated - Consult pulmonary if no improvement  * New onset atrial fibrillation On Cardizem drip.  Appreciate cardiology input.  Added oral amiodarone.  On therapeutic Lovenox.  -DVT prophylaxis On anticoagulation with subcu Lovenox  -Tobacco abuse Tobacco cessation counseled on admission  All the records are reviewed and case discussed with Care Management/Social Worker Management plans discussed with the patient, family and they are in agreement.  CODE STATUS: FULL CODE  DVT Prophylaxis: SCDs  TOTAL TIME TAKING CARE OF THIS PATIENT: 35 minutes.   POSSIBLE D/C IN 1-2 DAYS, DEPENDING ON CLINICAL CONDITION.  Molinda Bailiff Idabelle Mcpeters M.D on 10/01/2018 at 1:54 PM  Between 7am to 6pm - Pager - [phone]  After 6pm go to [email or web address] - password [email or web address]  [facility]  Office  [phone]  CC: Primary care physician; Patient, No Pcp Per  Note: This dictation was prepared with Dragon dictation along with smaller phrase technology. Any transcriptional errors that result from this process are unintentional.

## 2018-10-01 NOTE — Consult Note (Signed)
Caroline Rowe is a 57 y.o. female  [id number]  Primary Cardiologist: Adrian Blackwater Reason for Consultation: Atrial fibrillation  HPI: 57 year old female presented to the hospital with shortness of breath and was found to have atrial fibrillation with rapid ventricular response rate about 150/min.   Review of Systems: No chest pain   History reviewed. No pertinent past medical history.  Medications Prior to Admission  Medication Sig Dispense Refill  . ibuprofen (ADVIL,MOTRIN) 200 MG tablet Take 400-800 mg by mouth every 6 (six) hours as needed for mild pain.       Marland Kitchen amoxicillin-clavulanate  1 tablet Oral Q12H  . aspirin EC  81 mg Oral Daily  . enoxaparin (LOVENOX) injection  120 mg Subcutaneous Q12H  . ipratropium-albuterol  3 mL Nebulization Q6H  . methylPREDNISolone (SOLU-MEDROL) injection  60 mg Intravenous Q6H  . nicotine  21 mg Transdermal Daily  . sodium chloride flush  3 mL Intravenous Q12H    Infusions: . sodium chloride    . diltiazem (CARDIZEM) infusion Stopped (09/30/18 1035)  . diltiazem (CARDIZEM) infusion 5 mg/hr (09/30/18 1318)    No Known Allergies  Social History   Socioeconomic History  . Marital status: Married    Spouse name: Not on file  . Number of children: Not on file  . Years of education: Not on file  . Highest education level: Not on file  Occupational History  . Not on file  Social Needs  . Financial resource strain: Not on file  . Food insecurity:    Worry: Not on file    Inability: Not on file  . Transportation needs:    Medical: Not on file    Non-medical: Not on file  Tobacco Use  . Smoking status: Current Every Day Smoker  . Smokeless tobacco: Never Used  Substance and Sexual Activity  . Alcohol use: Never    Frequency: Never  . Drug use: Never  . Sexual activity: Not on file  Lifestyle  . Physical activity:    Days per week: Not on file    Minutes per session: Not on file  . Stress: Not on file  Relationships  .  Social connections:    Talks on phone: Not on file    Gets together: Not on file    Attends religious service: Not on file    Active member of club or organization: Not on file    Attends meetings of clubs or organizations: Not on file    Relationship status: Not on file  . Intimate partner violence:    Fear of current or ex partner: Not on file    Emotionally abused: Not on file    Physically abused: Not on file    Forced sexual activity: Not on file  Other Topics Concern  . Not on file  Social History Narrative  . Not on file    Family History  Problem Relation Age of Onset  . Heart disease Father     PHYSICAL EXAM: Vitals:   09/30/18 1948 09/30/18 2106  BP: 117/68   Pulse: (!) 103   Resp: (!) 22   Temp: 98.8 F (37.1 C)   SpO2: 96% 97%     Intake/Output Summary (Last 24 hours) at 10/01/2018 0028 Last data filed at 09/30/2018 1019 Gross per 24 hour  Intake 550 ml  Output -  Net 550 ml    General:  Well appearing. No respiratory difficulty HEENT: normal Neck: supple. no JVD. Carotids 2+ bilat;  no bruits. No lymphadenopathy or thryomegaly appreciated. Cor: PMI nondisplaced. Regular rate & rhythm. No rubs, gallops or murmurs. Lungs: clear Abdomen: soft, nontender, nondistended. No hepatosplenomegaly. No bruits or masses. Good bowel sounds. Extremities: no cyanosis, clubbing, rash, edema Neuro: alert & oriented x 3, cranial nerves grossly intact. moves all 4 extremities w/o difficulty. Affect pleasant.  ECG: A. fib with rapid ventricular response rate 150/min with low voltage and nonspecific ST-T changes  Results for orders placed or performed during the hospital encounter of 09/30/18 (from the past 24 hour(s))  CBC with Differential/Platelet     Status: Abnormal   Collection Time: 09/30/18  9:21 AM  Result Value Ref Range   WBC 8.8 4.0 - 10.5 K/uL   RBC 5.04 3.87 - 5.11 MIL/uL   Hemoglobin 14.8 12.0 - 15.0 g/dL   HCT 96.2 (H) 95.2 - 84.1 %   MCV 91.5 80.0  - 100.0 fL   MCH 29.4 26.0 - 34.0 pg   MCHC 32.1 30.0 - 36.0 g/dL   RDW 32.4 40.1 - 02.7 %   Platelets 277 150 - 400 K/uL   nRBC 0.0 0.0 - 0.2 %   Neutrophils Relative % 68 %   Neutro Abs 6.0 1.7 - 7.7 K/uL   Lymphocytes Relative 23 %   Lymphs Abs 2.1 0.7 - 4.0 K/uL   Monocytes Relative 7 %   Monocytes Absolute 0.6 0.1 - 1.0 K/uL   Eosinophils Relative 1 %   Eosinophils Absolute 0.1 0.0 - 0.5 K/uL   Basophils Relative 1 %   Basophils Absolute 0.0 0.0 - 0.1 K/uL   Immature Granulocytes 0 %   Abs Immature Granulocytes 0.02 0.00 - 0.07 K/uL  Comprehensive metabolic panel     Status: Abnormal   Collection Time: 09/30/18  9:21 AM  Result Value Ref Range   Sodium 140 135 - 145 mmol/L   Potassium 4.4 3.5 - 5.1 mmol/L   Chloride 105 98 - 111 mmol/L   CO2 25 22 - 32 mmol/L   Glucose, Bld 125 (H) 70 - 99 mg/dL   BUN 9 6 - 20 mg/dL   Creatinine, Ser 2.53 0.44 - 1.00 mg/dL   Calcium 8.8 (L) 8.9 - 10.3 mg/dL   Total Protein 7.8 6.5 - 8.1 g/dL   Albumin 3.6 3.5 - 5.0 g/dL   AST 21 15 - 41 U/L   ALT 14 0 - 44 U/L   Alkaline Phosphatase 69 38 - 126 U/L   Total Bilirubin 1.1 0.3 - 1.2 mg/dL   GFR calc non Af Amer >60 >60 mL/min   GFR calc Af Amer >60 >60 mL/min   Anion gap 10 5 - 15  Troponin I     Status: None   Collection Time: 09/30/18  9:21 AM  Result Value Ref Range   Troponin I <0.03 <0.03 ng/mL  Brain natriuretic peptide     Status: Abnormal   Collection Time: 09/30/18  9:21 AM  Result Value Ref Range   B Natriuretic Peptide 525.0 (H) 0.0 - 100.0 pg/mL  Troponin I     Status: None   Collection Time: 09/30/18  4:28 PM  Result Value Ref Range   Troponin I <0.03 <0.03 ng/mL  Troponin I     Status: None   Collection Time: 09/30/18  9:57 PM  Result Value Ref Range   Troponin I <0.03 <0.03 ng/mL   Dg Chest 2 View  Result Date: 09/30/2018 CLINICAL DATA:  Shortness of breath.  Cough. EXAM: CHEST -  2 VIEW COMPARISON:  None. FINDINGS: Central vascular structures are prominent  and appear enlarged. Interstitial lung markings are mildly prominent. The trachea is midline. No large pleural effusions. Negative for a pneumothorax. No acute bone abnormality. Heart size is within normal limits. IMPRESSION: Prominent central vascular and interstitial lung markings. Findings are concerning for vascular congestion or mild pulmonary edema. Electronically Signed   By: Richarda Overlie M.D.   On: 09/30/2018 10:24     ASSESSMENT AND PLAN: Atrial fibrillation with rapid ventricular response rate.  Patient is being treated properly with Cardizem IV.  Advise adding amiodarone 400 mg p.o. once a day and advise anticoagulation.  Will look at echocardiogram make further recommendation.  Conchita Truxillo A

## 2018-10-01 NOTE — Plan of Care (Signed)
  Problem: Health Behavior/Discharge Planning: Goal: Ability to manage health-related needs will improve Outcome: Not Progressing Note:  Patient claims is constipated, hasn't had a B.M. in about 2 days. No PRN medications available. Will pass along in shift report. Remains on a Cardizem drip at 5/hr. Will continue to monitor. Jari Favre [facility]

## 2018-10-01 NOTE — Progress Notes (Signed)
*  PRELIMINARY RESULTS* Echocardiogram 2D Echocardiogram has been performed.  Caroline Rowe 10/01/2018, 11:24 AM

## 2018-10-02 ENCOUNTER — Other Ambulatory Visit: Payer: Self-pay | Admitting: Cardiovascular Disease

## 2018-10-02 MED ORDER — SACUBITRIL-VALSARTAN 24-26 MG PO TABS
1.0000 | ORAL_TABLET | Freq: Two times a day (BID) | ORAL | Status: DC
  Administered 2018-10-02 – 2018-10-04 (×5): 1 via ORAL
  Filled 2018-10-02 (×5): qty 1

## 2018-10-02 MED ORDER — ENOXAPARIN SODIUM 150 MG/ML ~~LOC~~ SOLN
1.0000 mg/kg | Freq: Two times a day (BID) | SUBCUTANEOUS | Status: DC
  Administered 2018-10-02 – 2018-10-04 (×4): 135 mg via SUBCUTANEOUS
  Filled 2018-10-02 (×6): qty 0.9

## 2018-10-02 MED ORDER — GUAIFENESIN ER 600 MG PO TB12
600.0000 mg | ORAL_TABLET | Freq: Two times a day (BID) | ORAL | Status: DC
  Administered 2018-10-02: 600 mg via ORAL
  Filled 2018-10-02: qty 1

## 2018-10-02 MED ORDER — SODIUM CHLORIDE 0.9 % IV SOLN: 250.0000 mL | INTRAVENOUS | Status: AC

## 2018-10-02 MED ORDER — CARVEDILOL 3.125 MG PO TABS
3.1250 mg | ORAL_TABLET | Freq: Two times a day (BID) | ORAL | Status: DC
  Administered 2018-10-02 – 2018-10-04 (×2): 3.125 mg via ORAL
  Filled 2018-10-02 (×2): qty 1

## 2018-10-02 MED ORDER — SODIUM CHLORIDE 0.9 % IV SOLN
INTRAVENOUS | Status: AC
  Filled 2018-10-02: qty 1000

## 2018-10-02 MED ORDER — SUCCINYLCHOLINE CHLORIDE 20 MG/ML IJ SOLN
INTRAMUSCULAR | Status: AC
  Filled 2018-10-02: qty 1

## 2018-10-02 MED ORDER — SODIUM CHLORIDE 0.9 % IV SOLN: INTRAVENOUS | Status: DC

## 2018-10-02 MED ORDER — SODIUM CHLORIDE 0.9% FLUSH
3.0000 mL | INTRAVENOUS | Status: AC | PRN
  Filled 2018-10-02: qty 3

## 2018-10-02 MED ORDER — GUAIFENESIN ER 600 MG PO TB12: 600.0000 mg | ORAL_TABLET | Freq: Three times a day (TID) | ORAL | Status: DC

## 2018-10-02 MED ORDER — MIDAZOLAM HCL 2 MG/2ML IJ SOLN
INTRAMUSCULAR | Status: AC
  Filled 2018-10-02: qty 2

## 2018-10-02 MED ORDER — SODIUM CHLORIDE 0.9% FLUSH
3.0000 mL | Freq: Two times a day (BID) | INTRAVENOUS | Status: AC
  Filled 2018-10-02: qty 3

## 2018-10-02 MED ORDER — GUAIFENESIN ER 600 MG PO TB12
600.0000 mg | ORAL_TABLET | Freq: Two times a day (BID) | ORAL | Status: DC
  Administered 2018-10-04: 600 mg via ORAL
  Filled 2018-10-02 (×2): qty 1

## 2018-10-02 MED ORDER — ROCURONIUM BROMIDE 50 MG/5ML IV SOLN
INTRAVENOUS | Status: AC
  Filled 2018-10-02: qty 1

## 2018-10-02 MED ORDER — PROPOFOL 10 MG/ML IV BOLUS
INTRAVENOUS | Status: AC
  Filled 2018-10-02: qty 20

## 2018-10-02 MED ORDER — HYDROCOD POLST-CPM POLST ER 10-8 MG/5ML PO SUER
5.0000 mL | Freq: Two times a day (BID) | ORAL | Status: DC | PRN
  Administered 2018-10-02 – 2018-10-04 (×4): 5 mL via ORAL
  Filled 2018-10-02 (×4): qty 5

## 2018-10-02 NOTE — Care Management Note (Addendum)
Case Management Note  Patient Details  Name: Caroline Rowe MRN: [id number] Date of Birth: [birth year]  Subjective/Objective:      Patient from home with bronchitis like cough.  New diagnoses with A-Fib. Current with [facility].   No insurance.   Prior to hospitalization, no chronic prescribed medications per patient.  She is independent at home and lives with her daughter and two grandchildren.  No current services in the home.  Gets her medications at [facility].  Called [facility] to verify cost; Sherryll Burger will be $300.00/mo.  Notified Dr. Tobi Bastos.  30 day free coupon given to patient and starting an application for assistance through [place].  Patient will call number provided for [facility] and begin this afternoon.  Will send to medication management clinic for all other prescribed medications.  Denies difficulty with transportation.   Action/Plan: Will continue to follow and assist as patient progresses.                 Expected Discharge Date:                  Expected Discharge Plan:  Home/Self Care  In-House Referral:  Financial Counselor  Discharge planning Services  CM Consult  Post Acute Care Choice:    Choice offered to:     DME Arranged:    DME Agency:     HH Arranged:    HH Agency:     Status of Service:  In process, will continue to follow  If discussed at Long Length of Stay Meetings, dates discussed:    Additional Comments:  Sherren Kerns, RN 10/02/2018, 3:44 PM

## 2018-10-02 NOTE — Consult Note (Signed)
  Amiodarone Drug - Drug Interaction Consult Note  Recommendations: No changes needed-monitor HR Amiodarone is metabolized by the cytochrome P450 system and therefore has the potential to cause many drug interactions. Amiodarone has an average plasma half-life of 50 days (range 20 to 100 days).   There is potential for drug interactions to occur several weeks or months after stopping treatment and the onset of drug interactions may be slow after initiating amiodarone.   []  Statins: Increased risk of myopathy. Simvastatin- restrict dose to 20mg  daily. Other statins: counsel patients to report any muscle pain or weakness immediately.  []  Anticoagulants: Amiodarone can increase anticoagulant effect. Consider warfarin dose reduction. Patients should be monitored closely and the dose of anticoagulant altered accordingly, remembering that amiodarone levels take several weeks to stabilize.  []  Antiepileptics: Amiodarone can increase plasma concentration of phenytoin, the dose should be reduced. Note that small changes in phenytoin dose can result in large changes in levels. Monitor patient and counsel on signs of toxicity.  [x]  Beta blockers: increased risk of bradycardia, AV block and myocardial depression. Sotalol - avoid concomitant use.  []   Calcium channel blockers (diltiazem and verapamil): increased risk of bradycardia, AV block and myocardial depression.  []   Cyclosporine: Amiodarone increases levels of cyclosporine. Reduced dose of cyclosporine is recommended.  []  Digoxin dose should be halved when amiodarone is started.  []  Diuretics: increased risk of cardiotoxicity if hypokalemia occurs.  []  Oral hypoglycemic agents (glyburide, glipizide, glimepiride): increased risk of hypoglycemia. Patient's glucose levels should be monitored closely when initiating amiodarone therapy.   []  Drugs that prolong the QT interval:  Torsades de pointes risk may be increased with concurrent use - avoid if  possible.  Monitor QTc, also keep magnesium/potassium WNL if concurrent therapy can't be avoided. Marland Kitchen Antibiotics: e.g. fluoroquinolones, erythromycin. . Antiarrhythmics: e.g. quinidine, procainamide, disopyramide, sotalol. . Antipsychotics: e.g. phenothiazines, haloperidol.  . Lithium, tricyclic antidepressants, and methadone. Thank Aleda Grana  10/02/2018 1:54 PM

## 2018-10-02 NOTE — Progress Notes (Signed)
HR exceeds 150 when she walks to the BR.  Returns to 110-120s after a few minutes.

## 2018-10-02 NOTE — Progress Notes (Signed)
SUBJECTIVE: Feeling well, no chest pain or palpitations. Mild shortness of breath during coughing spells. Reports her cough only responds to codeine cough syrup and is requesting refill. She states, "I believe heart rate is high because my cough is not controlled."   Vitals:   10/02/18 0342 10/02/18 0620 10/02/18 0807 10/02/18 0827  BP: 96/71 105/84 122/77   Pulse: 95 80 90   Resp: 18  20   Temp: 98 F (36.7 C)  98 F (36.7 C)   TempSrc:   Oral   SpO2: 99%  96% 96%  Weight:      Height:        Intake/Output Summary (Last 24 hours) at 10/02/2018 0905 Last data filed at 10/02/2018 0551 Gross per 24 hour  Intake 487 ml  Output 1125 ml  Net -638 ml    LABS: Basic Metabolic Panel: Recent Labs    09/30/18 0921  NA 140  K 4.4  CL 105  CO2 25  GLUCOSE 125*  BUN 9  CREATININE 0.73  CALCIUM 8.8*   Liver Function Tests: Recent Labs    09/30/18 0921  AST 21  ALT 14  ALKPHOS 69  BILITOT 1.1  PROT 7.8  ALBUMIN 3.6   No results for input(s): LIPASE, AMYLASE in the last 72 hours. CBC: Recent Labs    09/30/18 0921  WBC 8.8  NEUTROABS 6.0  HGB 14.8  HCT 46.1*  MCV 91.5  PLT 277   Cardiac Enzymes: Recent Labs    09/30/18 1628 09/30/18 2157 10/01/18 0509  TROPONINI <0.03 <0.03 <0.03   BNP: Invalid input(s): POCBNP D-Dimer: No results for input(s): DDIMER in the last 72 hours. Hemoglobin A1C: No results for input(s): HGBA1C in the last 72 hours. Fasting Lipid Panel: No results for input(s): CHOL, HDL, LDLCALC, TRIG, CHOLHDL, LDLDIRECT in the last 72 hours. Thyroid Function Tests: No results for input(s): TSH, T4TOTAL, T3FREE, THYROIDAB in the last 72 hours.  Invalid input(s): FREET3 Anemia Panel: No results for input(s): VITAMINB12, FOLATE, FERRITIN, TIBC, IRON, RETICCTPCT in the last 72 hours.   PHYSICAL EXAM General: Well developed, well nourished, in no acute distress HEENT:  Normocephalic and atramatic Neck:  No JVD.  Lungs: Clear bilaterally  to auscultation and percussion. Heart: Irregular, borderline tachycardic.  Abdomen: Bowel sounds are positive, abdomen soft and non-tender  Msk:  Back normal, normal gait. Normal strength and tone for age. Extremities: No clubbing, cyanosis or edema.   Neuro: Alert and oriented X 3. Psych:  Good affect, responds appropriately  TELEMETRY: Atrial fibrillation 95bpm  ASSESSMENT AND PLAN:  New onset atrial fibrillation: Remains in atrial fibrillation. Continue amiodarone drip for now and will schedule TEE and cardioversion today.   Congestive heart failure with reduced EF: Echo results show 4 chamber dilatation with severe LV systolic dysfunction EF=30% with diffuse hypokinesis and moderate MR. Start coreg and Entresto for severe LV dysfunction.     Active Problems:   A-fib [facility])    Caroleen Hamman, NP-C 10/02/2018 9:05 AM Cell: [phone]

## 2018-10-02 NOTE — Progress Notes (Addendum)
Patient's heart rate in the 180s. Rounded on patient, another RN in room. Patient sitting up on the edge of the bed having a coughing spell. Patient states this happened last night, PRN medication for cough no due yet. MD paged, to notify.   Update: per MD, can change the PRN cough medication to every 8 hours.

## 2018-10-02 NOTE — Progress Notes (Signed)
ANTICOAGULATION CONSULT NOTE  Pharmacy Consult for enoxaparin Indication: atrial fibrillation  No Known Allergies  Patient Measurements: Height: 5\' 6"  (167.6 cm) Weight: 298 lb (135.2 kg) IBW/kg (Calculated) : 59.3   Vital Signs: Temp: 98 F (36.7 C) (10/21 0807) Temp Source: Oral (10/21 0807) BP: 122/77 (10/21 0807) Pulse Rate: 90 (10/21 0807)  Labs: Recent Labs    09/30/18 0921 09/30/18 1628 09/30/18 2157 10/01/18 0509  HGB 14.8  --   --   --   HCT 46.1*  --   --   --   PLT 277  --   --   --   CREATININE 0.73  --   --   --   TROPONINI <0.03 <0.03 <0.03 <0.03    Estimated Creatinine Clearance: 109.9 mL/min (by C-G formula based on SCr of 0.73 mg/dL).   Medical History: History reviewed. No pertinent past medical history.   Assessment: 57 year old female with new onset atrial fibrillation. Pharmacy consulted for dosing of enoxaparin. Patient on no anticoagulation PTA.  Goal of Therapy:  Monitor platelets by anticoagulation protocol: Yes   Plan:  Will continue lovenox. Increase dose to 1mg /kg (135mg  instead of 120) BID. Will push this afternoons dose out to 1700, to avoid middle of the night dosing.  Olene Floss, Pharm.D, BCPS Clinical Pharmacist 10/02/2018 1:51 PM

## 2018-10-02 NOTE — Progress Notes (Signed)
[employer] - [place] at [facility]   PATIENT NAME: Jameeka Marcy    MR#:  [id number]  DATE OF BIRTH:  December 21, 1960  SUBJECTIVE:  CHIEF COMPLAINT:   Chief Complaint  Patient presents with  . Shortness of Breath   Patient continues to have cough Another patient coughs and moves her on her heart rate goes up She is on IV amiodarone drip for rate control Heart rate persistently high in spite of being on amiodarone drip Has shortness of breath and wheezing  REVIEW OF SYSTEMS:    Review of Systems  Constitutional: Positive for malaise/fatigue. Negative for chills and fever.  HENT: Negative for sore throat.   Eyes: Negative for blurred vision, double vision and pain.  Respiratory: Positive for cough, shortness of breath and wheezing. Negative for hemoptysis.   Cardiovascular: Negative for chest pain, palpitations, orthopnea and leg swelling.  Gastrointestinal: Negative for abdominal pain, constipation, diarrhea, heartburn, nausea and vomiting.  Genitourinary: Negative for dysuria and hematuria.  Musculoskeletal: Negative for back pain and joint pain.  Skin: Negative for rash.  Neurological: Negative for sensory change, speech change, focal weakness and headaches.  Endo/Heme/Allergies: Does not bruise/bleed easily.  Psychiatric/Behavioral: Negative for depression. The patient is not nervous/anxious.     DRUG ALLERGIES:  No Known Allergies  VITALS:  Blood pressure 122/77, pulse 90, temperature 98 F (36.7 C), temperature source Oral, resp. rate 20, height 5\' 6"  (1.676 m), weight 135.2 kg, SpO2 96 %.  PHYSICAL EXAMINATION:   Physical Exam  GENERAL:  57 y.o.-year-old patient lying in the bed with conversational dyspnea.  Obese EYES: Pupils equal, round, reactive to light and accommodation. No scleral icterus. Extraocular muscles intact.  HEENT: Head atraumatic, normocephalic. Oropharynx and nasopharynx clear.  NECK:  Supple, no jugular venous distention. No  thyroid enlargement, no tenderness.  LUNGS: Increased work of breathing with bilateral wheezing CARDIOVASCULAR: Tachycardic, irregularly irregular ABDOMEN: Soft, nontender, nondistended. Bowel sounds present. No organomegaly or mass.  EXTREMITIES: No cyanosis, clubbing or edema b/l.    NEUROLOGIC: Cranial nerves II through XII are intact. No focal Motor or sensory deficits b/l.   PSYCHIATRIC: The patient is alert and oriented x 3.  SKIN: No obvious rash, lesion, or ulcer.   LABORATORY PANEL:   CBC Recent Labs  Lab 09/30/18 0921  WBC 8.8  HGB 14.8  HCT 46.1*  PLT 277   ------------------------------------------------------------------------------------------------------------------ Chemistries  Recent Labs  Lab 09/30/18 0921  NA 140  K 4.4  CL 105  CO2 25  GLUCOSE 125*  BUN 9  CREATININE 0.73  CALCIUM 8.8*  AST 21  ALT 14  ALKPHOS 69  BILITOT 1.1   ------------------------------------------------------------------------------------------------------------------  Cardiac Enzymes Recent Labs  Lab 10/01/18 0509  TROPONINI <0.03   ------------------------------------------------------------------------------------------------------------------  RADIOLOGY:  No results found.   ASSESSMENT AND PLAN:   57 year old female patient with active tobacco abuse presented to the emergency room for cough, wheezing and palpitation  - Acute bronchitis -IV steroids, Antibiotics to continue that is Augmentin - Scheduled Nebulizers - Inhalers -Oxygen weaned  - New onset atrial fibrillation Is post cardiology evaluation Patient on IV amiodarone drip but rate still high Continue oral Coreg Cardiology considering cardioversion and TEE  -DVT prophylaxis On anticoagulation with subcu Lovenox  -Tobacco abuse Tobacco cessation counseled on admission Nicotine patch offered  All the records are reviewed and case discussed with Care Management/Social Worker Management  plans discussed with the patient, family and they are in agreement.  CODE STATUS: FULL CODE  DVT Prophylaxis:  SCDs  TOTAL TIME TAKING CARE OF THIS PATIENT: 34 minutes.   POSSIBLE D/C IN 1-2 DAYS, DEPENDING ON CLINICAL CONDITION.  Ihor Austin M.D on 10/02/2018 at 11:17 AM  Between 7am to 6pm - Pager - [phone]  After 6pm go to [email or web address] - password [email or web address]  [facility] [place] [facility]  Office  [phone]  CC: Primary care physician; Patient, No Pcp Per  Note: This dictation was prepared with Dragon dictation along with smaller phrase technology. Any transcriptional errors that result from this process are unintentional.

## 2018-10-03 ENCOUNTER — Encounter: Payer: Self-pay | Admitting: *Deleted

## 2018-10-03 ENCOUNTER — Encounter
Admission: EM | Disposition: A | Discharge status: Home / Self Care | Payer: Self-pay | Source: Home / Self Care | Attending: Internal Medicine

## 2018-10-03 ENCOUNTER — Inpatient Hospital Stay: Payer: Self-pay | Admitting: Anesthesiology

## 2018-10-03 ENCOUNTER — Inpatient Hospital Stay
Admit: 2018-10-03 | Discharge: 2018-10-03 | Disposition: A | Discharge status: Home / Self Care | Payer: Self-pay | Attending: Registered Nurse | Admitting: Registered Nurse

## 2018-10-03 ENCOUNTER — Other Ambulatory Visit: Payer: Self-pay

## 2018-10-03 HISTORY — PX: CARDIOVERSION: EP1203

## 2018-10-03 HISTORY — PX: TEE WITHOUT CARDIOVERSION: SHX5443

## 2018-10-03 LAB — BASIC METABOLIC PANEL
ANION GAP: 7 (ref 5–15)
BUN: 16 mg/dL (ref 6–20)
CALCIUM: 8.3 mg/dL — AB (ref 8.9–10.3)
CO2: 27 mmol/L (ref 22–32)
Chloride: 105 mmol/L (ref 98–111)
Creatinine, Ser: 0.68 mg/dL (ref 0.44–1.00)
GFR calc non Af Amer: >60 mL/min (ref >60)
Glucose, Bld: 163 mg/dL — ABNORMAL HIGH (ref 70–99)
POTASSIUM: 4 mmol/L (ref 3.5–5.1)
Sodium: 139 mmol/L (ref 135–145)

## 2018-10-03 LAB — HEMOGLOBIN AND HEMATOCRIT, BLOOD
HEMATOCRIT: 45.6 % (ref 36.0–46.0)
Hemoglobin: 14.6 g/dL (ref 12.0–15.0)

## 2018-10-03 LAB — HIV ANTIBODY (ROUTINE TESTING W REFLEX): HIV SCREEN 4TH GENERATION: NONREACTIVE

## 2018-10-03 SURGERY — ECHOCARDIOGRAM, TRANSESOPHAGEAL
Anesthesia: General

## 2018-10-03 MED ORDER — SUCCINYLCHOLINE CHLORIDE 20 MG/ML IJ SOLN
INTRAMUSCULAR | Status: AC
  Filled 2018-10-03: qty 1

## 2018-10-03 MED ORDER — PROPOFOL 10 MG/ML IV BOLUS
INTRAVENOUS | Status: DC | PRN
  Administered 2018-10-03: 30 mg via INTRAVENOUS

## 2018-10-03 MED ORDER — SODIUM CHLORIDE FLUSH 0.9 % IV SOLN
INTRAVENOUS | Status: AC
  Filled 2018-10-03: qty 10

## 2018-10-03 MED ORDER — ONDANSETRON HCL 4 MG/2ML IJ SOLN
INTRAMUSCULAR | Status: AC
  Filled 2018-10-03: qty 2

## 2018-10-03 MED ORDER — DEXAMETHASONE SODIUM PHOSPHATE 10 MG/ML IJ SOLN
INTRAMUSCULAR | Status: AC
  Filled 2018-10-03: qty 1

## 2018-10-03 MED ORDER — LIDOCAINE HCL (CARDIAC) PF 100 MG/5ML IV SOSY
PREFILLED_SYRINGE | INTRAVENOUS | Status: DC | PRN
  Administered 2018-10-03: 60 mg via INTRAVENOUS

## 2018-10-03 MED ORDER — OXYCODONE-ACETAMINOPHEN 5-325 MG PO TABS
1.0000 | ORAL_TABLET | ORAL | Status: DC | PRN
  Administered 2018-10-04 (×3): 1 via ORAL
  Filled 2018-10-03 (×3): qty 1

## 2018-10-03 MED ORDER — OXYCODONE-ACETAMINOPHEN 5-325 MG PO TABS
1.0000 | ORAL_TABLET | Freq: Four times a day (QID) | ORAL | Status: DC | PRN
  Administered 2018-10-03 (×2): 1 via ORAL
  Filled 2018-10-03 (×2): qty 1

## 2018-10-03 MED ORDER — ROCURONIUM BROMIDE 50 MG/5ML IV SOLN
INTRAVENOUS | Status: AC
  Filled 2018-10-03: qty 1

## 2018-10-03 MED ORDER — IPRATROPIUM-ALBUTEROL 0.5-2.5 (3) MG/3ML IN SOLN
RESPIRATORY_TRACT | Status: AC
  Filled 2018-10-03: qty 3

## 2018-10-03 MED ORDER — MIDAZOLAM HCL 2 MG/2ML IJ SOLN
INTRAMUSCULAR | Status: AC
  Filled 2018-10-03: qty 2

## 2018-10-03 MED ORDER — PHENYLEPHRINE HCL 10 MG/ML IJ SOLN
INTRAMUSCULAR | Status: AC
  Filled 2018-10-03: qty 1

## 2018-10-03 MED ORDER — PROPOFOL 500 MG/50ML IV EMUL
INTRAVENOUS | Status: DC | PRN
  Administered 2018-10-03: 90 ug/kg/min via INTRAVENOUS

## 2018-10-03 MED ORDER — AMIODARONE HCL 200 MG PO TABS
400.0000 mg | ORAL_TABLET | Freq: Two times a day (BID) | ORAL | Status: DC
  Administered 2018-10-04: 400 mg via ORAL
  Filled 2018-10-03: qty 2

## 2018-10-03 MED ORDER — EPHEDRINE SULFATE 50 MG/ML IJ SOLN
INTRAMUSCULAR | Status: AC
  Filled 2018-10-03: qty 1

## 2018-10-03 MED ORDER — SUGAMMADEX SODIUM 200 MG/2ML IV SOLN
INTRAVENOUS | Status: AC
  Filled 2018-10-03: qty 2

## 2018-10-03 MED ORDER — FENTANYL CITRATE (PF) 100 MCG/2ML IJ SOLN
INTRAMUSCULAR | Status: AC
  Filled 2018-10-03: qty 2

## 2018-10-03 MED ORDER — LIDOCAINE VISCOUS HCL 2 % MT SOLN
OROMUCOSAL | Status: AC
  Administered 2018-10-03: 15 mL
  Filled 2018-10-03: qty 15

## 2018-10-03 MED ORDER — HYDROCOD POLST-CPM POLST ER 10-8 MG/5ML PO SUER
ORAL | Status: AC
  Administered 2018-10-03: 13:00:00
  Filled 2018-10-03: qty 5

## 2018-10-03 MED ORDER — BUTAMBEN-TETRACAINE-BENZOCAINE 2-2-14 % EX AERO
INHALATION_SPRAY | CUTANEOUS | Status: AC
  Administered 2018-10-03: 13:00:00
  Filled 2018-10-03: qty 5

## 2018-10-03 MED ORDER — PROPOFOL 10 MG/ML IV BOLUS
INTRAVENOUS | Status: AC
  Filled 2018-10-03: qty 40

## 2018-10-03 NOTE — Anesthesia Post-op Follow-up Note (Signed)
Anesthesia QCDR form completed.        

## 2018-10-03 NOTE — Progress Notes (Signed)
  [name]   MRN: [id number] DOB:  1961/11/29   ADMIT DATE: 09/30/2018  Procedure: Electrical Cardioversion Indications:  Atrial Fibrillation  Procedure Details:    Time Out: Verified patient identification, verified procedure, site/side was marked, verified correct patient position, special equipment/implants available, medications/allergies/relevent history reviewed, required imaging and test results available.    Patient placed on cardiac monitor, pulse oximetry, supplemental oxygen as necessary.  Sedation given:  Pacer pads placed   Cardioverted .  Normal sinus rhythm Cardioverted at 200 J  Evaluation: Findings: Post procedure EKG shows: Normal sinus rhythm Complications: None Patient did well.     Caroline Rowe, M.D. [facility]   10/03/2018 2:59 PM

## 2018-10-03 NOTE — Progress Notes (Signed)
Spoke to Dr Welton Flakes concerning consent orders. Verbal order for TEE and cardioversion on consent.

## 2018-10-03 NOTE — Transfer of Care (Signed)
Immediate Anesthesia Transfer of Care Note  Patient: Caroline Rowe  Procedure(s) Performed: TRANSESOPHAGEAL ECHOCARDIOGRAM (TEE) (N/A ) CARDIOVERSION (N/A )  Patient Location: PACU  Anesthesia Type:General  Level of Consciousness: awake and patient cooperative  Airway & Oxygen Therapy: Patient Spontanous Breathing and Patient connected to face mask oxygen  Post-op Assessment: Report given to RN and Post -op Vital signs reviewed and stable  Post vital signs: Reviewed and stable  Last Vitals:  Vitals Value Taken Time  BP 107/72 10/03/2018 12:59 PM  Temp    Pulse 69 10/03/2018 12:50 PM  Resp 15 10/03/2018 12:50 PM  SpO2 99 % 10/03/2018 12:50 PM    Last Pain:  Vitals:   10/03/18 1214  TempSrc: Oral  PainSc: 0-No pain         Complications:

## 2018-10-03 NOTE — Progress Notes (Signed)
Dr.Khan paged to ask about amiodarone gtt. Verbal orders from Dr.Khan to continue drip until tomorrow at 8am then change to 400 mg PO Amiodarone BID. Verbal with read back confirmed.

## 2018-10-03 NOTE — Progress Notes (Signed)
*  PRELIMINARY RESULTS* Echocardiogram Echocardiogram Transesophageal has been performed.  Cristela Blue 10/03/2018, 12:58 PM

## 2018-10-03 NOTE — Care Management (Signed)
To patient's room to see how she did with filling out Entresto assistance application and ask if she called the [facility] medication assistance number as she stated she would do yesterday.  She has not started the application and states she could not get through to anyone with the [facility] assistance program.  RNCM called the number and was able to speak to a representative.  They explained the application needs to be filled out and faxed to them.  Will speak to patient again about filling out application.

## 2018-10-03 NOTE — Progress Notes (Signed)
[employer] - [place] at [facility]   PATIENT NAME: Caroline Rowe    MR#:  [id number]  DATE OF BIRTH:  [birth year]  SUBJECTIVE:  CHIEF COMPLAINT:   Chief Complaint  Patient presents with  . Shortness of Breath   Patient seen and evaluated today Cough improved after being started on Tussionex medication No complaints of any chest pain Decreased shortness of breath Plan for TEE and cardioversion today  REVIEW OF SYSTEMS:    Review of Systems  Constitutional: Negative.   HENT: Negative.   Eyes: Negative.   Respiratory: Decreased cough and wheezing Cardiovascular: Negative.    Gastrointestinal: Negative.   Genitourinary: Negative.   Musculoskeletal: Negative.   Skin: Negative.   Neurological: Negative.   Endo/Heme/Allergies: Negative.   Psychiatric/Behavioral: Negative.   All other systems reviewed and are negative.  DRUG ALLERGIES:  No Known Allergies  VITALS:  Blood pressure 129/86, pulse (!) 102, temperature 98.1 F (36.7 C), temperature source Oral, resp. rate (!) 21, height 5\' 6"  (1.676 m), weight (!) 138 kg, SpO2 99 %.  PHYSICAL EXAMINATION:   Physical Exam  GENERAL:  57 y.o.-year-old patient lying in the bed with conversational dyspnea.  Obese EYES: Pupils equal, round, reactive to light and accommodation. No scleral icterus. Extraocular muscles intact.  HEENT: Head atraumatic, normocephalic. Oropharynx and nasopharynx clear.  NECK:  Supple, no jugular venous distention. No thyroid enlargement, no tenderness.  LUNGS: Decreased work of breathing with decreased bilateral wheezing CARDIOVASCULAR: Tachycardic, irregularly irregular ABDOMEN: Soft, nontender, nondistended. Bowel sounds present. No organomegaly or mass.  EXTREMITIES: No cyanosis, clubbing or edema b/l.    NEUROLOGIC: Cranial nerves II through XII are intact. No focal Motor or sensory deficits b/l.   PSYCHIATRIC: The patient is alert and oriented x 3.  SKIN: No obvious rash, lesion,  or ulcer.   LABORATORY PANEL:   CBC Recent Labs  Lab 09/30/18 0921  WBC 8.8  HGB 14.8  HCT 46.1*  PLT 277   ------------------------------------------------------------------------------------------------------------------ Chemistries  Recent Labs  Lab 09/30/18 0921 10/03/18 0452  NA 140 139  K 4.4 4.0  CL 105 105  CO2 25 27  GLUCOSE 125* 163*  BUN 9 16  CREATININE 0.73 0.68  CALCIUM 8.8* 8.3*  AST 21  --   ALT 14  --   ALKPHOS 69  --   BILITOT 1.1  --    ------------------------------------------------------------------------------------------------------------------  Cardiac Enzymes Recent Labs  Lab 10/01/18 0509  TROPONINI <0.03   ------------------------------------------------------------------------------------------------------------------  RADIOLOGY:  No results found.   ASSESSMENT AND PLAN:   57 year old female patient with active tobacco abuse presented to the emergency room for cough, wheezing and palpitation  - Acute bronchitis improving -Discontinue IV steroids and start p.o. Steroids Continue oral Augmentin - Scheduled Nebulizers - Inhalers -Oxygen weaned  - New onset atrial fibrillation Plan for cardioversion and transthoracic echocardiogram today Appreciate cardiology follow-up Patient on amiodarone drip for rate control so far Continue oral Coreg  -DVT prophylaxis On anticoagulation with subcu Lovenox  -Tobacco abuse Tobacco cessation counseled on admission Nicotine patch offered  All the records are reviewed and case discussed with Care Management/Social Worker Management plans discussed with the patient, family and they are in agreement.  CODE STATUS: FULL CODE  DVT Prophylaxis: SCDs  TOTAL TIME TAKING CARE OF THIS PATIENT: 32 minutes.   POSSIBLE D/C IN 1-2 DAYS, DEPENDING ON CLINICAL CONDITION.  Ihor Austin M.D on 10/03/2018 at 12:40 PM  Between 7am to 6pm - Pager - [phone]  After 6pm  go to  [email or web address] - password [email or web address]  [facility] [place] [facility]  Office  [phone]  CC: Primary care physician; Patient, No Pcp Per  Note: This dictation was prepared with Dragon dictation along with smaller phrase technology. Any transcriptional errors that result from this process are unintentional.

## 2018-10-03 NOTE — Progress Notes (Addendum)
Dr. Tobi Bastos called to notify of patient's blood pressure of 99/55. Per verbal orders, hold the scheduled carvedilol for this evening and get hemoglobin and hematocrit blood draw. Will hold and continue to monitor patient.  Patient currently has no complaints.

## 2018-10-03 NOTE — Plan of Care (Signed)
  Problem: Education: Goal: Knowledge of General Education information will improve Description Including pain rating scale, medication(s)/side effects and non-pharmacologic comfort measures Outcome: Progressing   Problem: Health Behavior/Discharge Planning: Goal: Ability to manage health-related needs will improve Outcome: Progressing   Problem: Education: Goal: Knowledge of disease or condition will improve Outcome: Progressing Goal: Understanding of medication regimen will improve Outcome: Progressing   Problem: Activity: Goal: Ability to tolerate increased activity will improve Outcome: Progressing

## 2018-10-03 NOTE — Anesthesia Preprocedure Evaluation (Signed)
Anesthesia Evaluation  Patient identified by MRN, date of birth, ID band Patient awake    Reviewed: Allergy & Precautions, NPO status , Patient's Chart, lab work & pertinent test results  History of Anesthesia Complications Negative for: history of anesthetic complications  Airway Mallampati: III  TM Distance: >3 FB Neck ROM: Full    Dental  (+) Edentulous Upper, Poor Dentition, Missing   Pulmonary asthma , neg sleep apnea, neg COPD, Current Smoker,    breath sounds clear to auscultation- rhonchi (-) wheezing      Cardiovascular (-) hypertension(-) CAD, (-) Past MI, (-) Cardiac Stents and (-) CABG + dysrhythmias Atrial Fibrillation  Rhythm:Regular Rate:Normal - Systolic murmurs and - Diastolic murmurs    Neuro/Psych negative neurological ROS  negative psych ROS   GI/Hepatic negative GI ROS, Neg liver ROS,   Endo/Other  negative endocrine ROSneg diabetes  Renal/GU negative Renal ROS     Musculoskeletal negative musculoskeletal ROS (+)   Abdominal (+) + obese,   Peds  Hematology negative hematology ROS (+)   Anesthesia Other Findings    Reproductive/Obstetrics                             Anesthesia Physical Anesthesia Plan  ASA: II  Anesthesia Plan: General   Post-op Pain Management:    Induction: Intravenous  PONV Risk Score and Plan: 1 and Propofol infusion  Airway Management Planned: Natural Airway  Additional Equipment:   Intra-op Plan:   Post-operative Plan:   Informed Consent: I have reviewed the patients History and Physical, chart, labs and discussed the procedure including the risks, benefits and alternatives for the proposed anesthesia with the patient or authorized representative who has indicated his/her understanding and acceptance.   Dental advisory given  Plan Discussed with: CRNA and Anesthesiologist  Anesthesia Plan Comments:         Anesthesia  Quick Evaluation

## 2018-10-03 NOTE — Progress Notes (Addendum)
Patient complaining of pain 10/10 on her lower back. Per patient she "pulled something" during procedure when they were putting the pacer pads on. PRN medication obtained and given. Patient requesting some kind brace because it hurts more when she has "coughing spells". MD paged to notify, per Dr. Tobi Bastos ok to order the back brace. Lumbar corsett ordered, called supplies to obtain. Per supplies they will have to order it. Will continue to monitor patient until then.

## 2018-10-03 NOTE — Progress Notes (Signed)
Dr. Welton Flakes and anesthesia at bedside. Sync.cardioversion at 200 joules and converted to NSR rate 60's. Patient went into bronchial spasms. Duoneb SVN treatment given. BP dropped into  74/40 due to bronchial spasms.  Suctioning up thick blood tinged mucus.  Tussinex given PO once coughing eased off to help suppress coughing.  BP up to 107/72 after coughing episode. On [place] 5l/m. C/O of lower back discomfort of coughing episode.

## 2018-10-03 NOTE — Progress Notes (Signed)
SUBJECTIVE: Feeling much better after cardioversion   Vitals:   10/03/18 1335 10/03/18 1340 10/03/18 1345 10/03/18 1409  BP: 95/68   106/81  Pulse:  (!) 58 (!) 58 (!) 57  Resp: 20 (!) 21    Temp:      TempSrc:      SpO2:  98% 100% 99%  Weight:      Height:        Intake/Output Summary (Last 24 hours) at 10/03/2018 1457 Last data filed at 10/03/2018 1249 Gross per 24 hour  Intake 535.3 ml  Output 920 ml  Net -384.7 ml    LABS: Basic Metabolic Panel: Recent Labs    10/03/18 0452  NA 139  K 4.0  CL 105  CO2 27  GLUCOSE 163*  BUN 16  CREATININE 0.68  CALCIUM 8.3*   Liver Function Tests: No results for input(s): AST, ALT, ALKPHOS, BILITOT, PROT, ALBUMIN in the last 72 hours. No results for input(s): LIPASE, AMYLASE in the last 72 hours. CBC: No results for input(s): WBC, NEUTROABS, HGB, HCT, MCV, PLT in the last 72 hours. Cardiac Enzymes: Recent Labs    09/30/18 1628 09/30/18 2157 10/01/18 0509  TROPONINI <0.03 <0.03 <0.03   BNP: Invalid input(s): POCBNP D-Dimer: No results for input(s): DDIMER in the last 72 hours. Hemoglobin A1C: No results for input(s): HGBA1C in the last 72 hours. Fasting Lipid Panel: No results for input(s): CHOL, HDL, LDLCALC, TRIG, CHOLHDL, LDLDIRECT in the last 72 hours. Thyroid Function Tests: No results for input(s): TSH, T4TOTAL, T3FREE, THYROIDAB in the last 72 hours.  Invalid input(s): FREET3 Anemia Panel: No results for input(s): VITAMINB12, FOLATE, FERRITIN, TIBC, IRON, RETICCTPCT in the last 72 hours.   PHYSICAL EXAM General: Well developed, well nourished, in no acute distress HEENT:  Normocephalic and atramatic Neck:  No JVD.  Lungs: Clear bilaterally to auscultation and percussion. Heart: HRRR . Normal S1 and S2 without gallops or murmurs.  Abdomen: Bowel sounds are positive, abdomen soft and non-tender  Msk:  Back normal, normal gait. Normal strength and tone for age. Extremities: No clubbing, cyanosis or edema.    Neuro: Alert and oriented X 3. Psych:  Good affect, responds appropriately  TELEMETRY: Sinus rhythm  ASSESSMENT AND PLAN: Status post cardioversion after transesophageal echocardiogram showing no evidence of thrombi in the left atrium or left atrial appendage with four-chamber dilatation and severe left ventricular systolic dysfunction.  Patient was electively cart cardioverted with 200 J without any problem.  Advise starting amiodarone p.o. 400 mg p.o. twice daily after 8 AM tomorrow after discontinuation of IV amiodarone.  Patient probably has sleep apnea will need sleep study scheduled prior to discharge at the hospital because has no insurance.  Advise continuing her carvedilol and Entresto and add spironolactone if blood pressure is okay tomorrow.  Active Problems:   A-fib (HCC)    Rowe Nancy, MD, [facility] 10/03/2018 2:57 PM

## 2018-10-04 MED ORDER — HYDROCOD POLST-CPM POLST ER 10-8 MG/5ML PO SUER: 5.0000 mL | Freq: Two times a day (BID) | ORAL | 0 refills | Status: AC | PRN

## 2018-10-04 MED ORDER — OXYCODONE-ACETAMINOPHEN 5-325 MG PO TABS: 1.0000 | ORAL_TABLET | Freq: Four times a day (QID) | ORAL | 0 refills | Status: AC | PRN

## 2018-10-04 MED ORDER — AMIODARONE HCL 400 MG PO TABS: 400.0000 mg | ORAL_TABLET | Freq: Two times a day (BID) | ORAL | 0 refills | Status: AC

## 2018-10-04 MED ORDER — SACUBITRIL-VALSARTAN 24-26 MG PO TABS: 1.0000 | ORAL_TABLET | Freq: Two times a day (BID) | ORAL | 0 refills | Status: AC

## 2018-10-04 MED ORDER — PREDNISONE 10 MG PO TABS: 10.0000 mg | ORAL_TABLET | Freq: Every day | ORAL | 0 refills | Status: AC

## 2018-10-04 MED ORDER — APIXABAN 5 MG PO TABS: 5.0000 mg | ORAL_TABLET | Freq: Two times a day (BID) | ORAL | 0 refills | Status: AC

## 2018-10-04 MED ORDER — CARVEDILOL 3.125 MG PO TABS: 3.1250 mg | ORAL_TABLET | Freq: Two times a day (BID) | ORAL | 0 refills | Status: AC

## 2018-10-04 MED ORDER — APIXABAN 5 MG PO TABS
5.0000 mg | ORAL_TABLET | Freq: Two times a day (BID) | ORAL | Status: DC
  Administered 2018-10-04: 5 mg via ORAL
  Filled 2018-10-04: qty 1

## 2018-10-04 NOTE — Anesthesia Postprocedure Evaluation (Signed)
Anesthesia Post Note  Patient: Caroline Rowe  Procedure(s) Performed: TRANSESOPHAGEAL ECHOCARDIOGRAM (TEE) (N/A ) CARDIOVERSION (N/A )  Patient location during evaluation: PACU Anesthesia Type: General Level of consciousness: awake and alert Pain management: pain level controlled Vital Signs Assessment: post-procedure vital signs reviewed and stable Respiratory status: spontaneous breathing, nonlabored ventilation, respiratory function stable and patient connected to nasal cannula oxygen Cardiovascular status: blood pressure returned to baseline and stable Postop Assessment: no apparent nausea or vomiting Anesthetic complications: no     Last Vitals:  Vitals:   10/04/18 0827 10/04/18 0848  BP:    Pulse:    Resp:    Temp:    SpO2: 97% 97%    Last Pain:  Vitals:   10/04/18 1215  TempSrc:   PainSc: 4                  Yevette Edwards

## 2018-10-04 NOTE — Plan of Care (Signed)
  Problem: Pain Managment: Goal: General experience of comfort will improve Outcome: Not Progressing   

## 2018-10-04 NOTE — Progress Notes (Signed)
Patient alert and oriented, vss, minimal pain in back.  Back brace given to patient via [employer] and she states it really helps her back feel better than it has been feeling.    Patients sister present at discharge.  Patient to be escorted out of hospital via wheelchair by volunteers.

## 2018-10-04 NOTE — Care Management Note (Signed)
Case Management Note  Patient Details  Name: Caroline Rowe MRN: [id number] Date of Birth: [birth year]  Subjective/Objective:     Patient discharging today to home.  Prescriptions given to patient to take to medication management clinic.    She will also take the [facility] application there for further assistance with obtaining medication through [employer] assistance program.  Corset brace delivered to patients from from [employer].   Sister is going to pick up medications prior to discharge. Patient will follow up with [facility].              Action/Plan:No further needs identified at this time by CM.    Expected Discharge Date:  10/04/18               Expected Discharge Plan:  Home/Self Care  In-House Referral:  Financial Counselor  Discharge planning Services  CM Consult, Medication Assistance  Post Acute Care Choice:    Choice offered to:  NA([employer] through [facility])  DME Arranged:  Brace, Back DME Agency:     HH Arranged:    HH Agency:     Status of Service:  In process, will continue to follow  If discussed at Long Length of Stay Meetings, dates discussed:    Additional Comments:  Sherren Kerns, RN 10/04/2018, 1:51 PM

## 2018-10-04 NOTE — Progress Notes (Signed)
Provided patient with "Living Better with Heart Failure" packet. Briefly reviewed definition of heart failure and signs and symptoms of an exacerbation. Reviewed importance of and reason behind checking weight daily in the AM, after using the bathroom, but before getting dressed. Discussed when to call the Dr= weight gain of >2lb overnight of 5lb in a week,  Discussed yellow zone= call MD: weight gain of >2lb overnight of 5lb in a week, increased swelling, increased SOB when lying down, chest discomfort, dizziness, increased fatigue Red Zone= call 911: struggle to breath, fainting or near fainting, significant chest pain  Reviewed medication changes:amiodarone, entresto, apixaban, coreg Explained briefly why pt is on the medications (either make you feel better, live longer or keep you out of the hospital) and discussed monitoring and side effects  Provided patient with coupon for apixaban 30 day trial. Encouraged patient to take coupon for apixaban and entresto to any retail pharmacy to fill. Will ask MD for second Rx to fill at medication management. Patient already has forms for patient assistance for Entresto-just needs cardiologist to fill out rest. Encouraged patient to ask Cardiologist for samples as patient assistance will probably not process before she runs out of free 30 days. Medication management clinic does have apixaban. Called and confirmed  Olene Floss, Pharm.D, BCPS Clinical Pharmacist

## 2018-10-04 NOTE — Progress Notes (Signed)
SUBJECTIVE: Feels much better today.   Vitals:   10/03/18 2005 10/04/18 0149 10/04/18 0437 10/04/18 0807  BP:   115/68 114/62  Pulse:   (!) 48 (!) 50  Resp:      Temp:   97.6 F (36.4 C) 98 F (36.7 C)  TempSrc:   Oral Oral  SpO2: 98% 97% 98% 97%  Weight:   (!) 139 kg   Height:        Intake/Output Summary (Last 24 hours) at 10/04/2018 0856 Last data filed at 10/04/2018 0441 Gross per 24 hour  Intake 100 ml  Output 1800 ml  Net -1700 ml    LABS: Basic Metabolic Panel: Recent Labs    10/03/18 0452  NA 139  K 4.0  CL 105  CO2 27  GLUCOSE 163*  BUN 16  CREATININE 0.68  CALCIUM 8.3*   Liver Function Tests: No results for input(s): AST, ALT, ALKPHOS, BILITOT, PROT, ALBUMIN in the last 72 hours. No results for input(s): LIPASE, AMYLASE in the last 72 hours. CBC: Recent Labs    10/03/18 1725  HGB 14.6  HCT 45.6   Cardiac Enzymes: No results for input(s): CKTOTAL, CKMB, CKMBINDEX, TROPONINI in the last 72 hours. BNP: Invalid input(s): POCBNP D-Dimer: No results for input(s): DDIMER in the last 72 hours. Hemoglobin A1C: No results for input(s): HGBA1C in the last 72 hours. Fasting Lipid Panel: No results for input(s): CHOL, HDL, LDLCALC, TRIG, CHOLHDL, LDLDIRECT in the last 72 hours. Thyroid Function Tests: No results for input(s): TSH, T4TOTAL, T3FREE, THYROIDAB in the last 72 hours.  Invalid input(s): FREET3 Anemia Panel: No results for input(s): VITAMINB12, FOLATE, FERRITIN, TIBC, IRON, RETICCTPCT in the last 72 hours.   PHYSICAL EXAM General: Well developed, well nourished, in no acute distress HEENT:  Normocephalic and atramatic Neck:  No JVD.  Lungs: Clear bilaterally to auscultation and percussion. Heart: HRRR . Normal S1 and S2 without gallops or murmurs.  Abdomen: Bowel sounds are positive, abdomen soft and non-tender  Msk:  Back normal, normal gait. Normal strength and tone for age. Extremities: No clubbing, cyanosis or edema.   Neuro:  Alert and oriented X 3. Psych:  Good affect, responds appropriately  TELEMETRY: sinus bradycardia 52bpm  ASSESSMENT AND PLAN: Atrial fibrillation: s/p successful cardioversion. Remains in sinus bradycardia. May discharge today on 400mg  amiodarone BID with Eliquis 5mg  BID. Advise outpatient sleep study. Follow up with Dr. Welton Flakes Monday 10/28.  CHF with reduced EF: Continue Entresto and carvedilol with close follow up with Dr. Welton Flakes for titration and monitoring.    Active Problems:   A-fib [facility])    Caroleen Hamman, NP-C 10/04/2018 8:56 AM Cell: [phone]

## 2018-10-04 NOTE — Discharge Summary (Signed)
[facility] [place] [facility]   PATIENT NAME: Caroline Rowe    MR#:  [id number]  DATE OF BIRTH:  July 04, 1961  DATE OF ADMISSION:  09/30/2018 ADMITTING PHYSICIAN: Ihor Austin, MD  DATE OF DISCHARGE: 10/04/2018  PRIMARY CARE PHYSICIAN: Patient, No Pcp Per   ADMISSION DIAGNOSIS:  COPD with acute exacerbation (HCC) [J44.1] Atrial fibrillation with RVR (HCC) [I48.91]  DISCHARGE DIAGNOSIS:  Active Problems:   A-fib (HCC) Acute bronchitis Tobacco abuse Diabetes mellitus type 2 New onset COPD with exacerbation SECONDARY DIAGNOSIS:  History reviewed. No pertinent past medical history.   ADMITTING HISTORY Caroline Rowe  is a 57 y.o. female with no significant past medical history presented to emergency room for cough and wheezing.  Cough has been going on for the last couple of weeks.  She is an active smoker.  Patient also had some palpitations since 1 day.  She was evaluated in the emergency room was found to be in atrial fibrillation with rapid rate with a rate of 150 bpm.  Patient was started on IV Cardizem drip in the emergency room.  She received a nebulization treatment and Solu-Medrol for COPD flare.  No complaints of any chest pain.  Hospitalist service was consulted for further care  HOSPITAL COURSE:  Patient was admitted to telemetry.Marland Kitchen She was started on IV Solu-Medrol and aggressive nebulization treatments.  She was treated with Augmentin antibiotic.  Tobacco cessation was counseled and nicotine patch offered.  Heart rate was controlled with IV Cardizem drip and later on patient was started on IV amiodarone drip for rate control.  Patient's heart rate could not be controlled with IV amiodarone drip and hence patient was successfully cardioverted by cardiology Dr. Welton Flakes and patient also had transthoracic echocardiogram.  Patient was anticoagulated with full dose Lovenox subcutaneously.  She was switched to oral Eliquis for antic regulation as outpatient and will  be discharged home on oral amiodarone and Coreg along with Entresto.  During hospitalization patient also had some low back pain for which a lumbar corset was advised along with pain management. patient will be discharged home on tapering dose of steroids and follow-up with cardiology in the clinic.  TEE Successful cardioversion. No cardiac source of emboli was   indentified. 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  Echocardiogram : Left ventricle: The cavity size was severely dilated. The   estimated ejection fraction was 30%. Diffuse hypokinesis. The   study is not technically sufficient to allow evaluation of LV   diastolic function. - Mitral valve: There was moderate regurgitation. - Right ventricle: The cavity size was severely dilated.  Impressions:  - 4 chamber dilatation with severe LV systolic dysfunction with   diffuse hypokinesis and moderate MR.  CONSULTS OBTAINED:  Treatment Team:  Laurier Nancy, MD  DRUG ALLERGIES:  No Known Allergies  DISCHARGE MEDICATIONS:   Allergies as of 10/04/2018   No Known Allergies     Medication List    STOP taking these medications   ibuprofen 200 MG tablet Commonly known as:  ADVIL,MOTRIN     TAKE these medications   amiodarone 400 MG tablet Commonly known as:  PACERONE Take 1 tablet (400 mg total) by mouth 2 (two) times daily.   apixaban 5 MG Tabs tablet Commonly known as:  ELIQUIS Take 1 tablet (5 mg total) by mouth 2 (two) times daily.   carvedilol 3.125 MG tablet Commonly known as:  COREG Take 1 tablet (3.125 mg total)  by mouth 2 (two) times daily with a meal.   chlorpheniramine-HYDROcodone 10-8 MG/5ML Suer Commonly known as:  TUSSIONEX Take 5 mLs by mouth every 12 (twelve) hours as needed for cough.   oxyCODONE-acetaminophen 5-325 MG tablet Commonly known as:  PERCOCET/ROXICET Take 1 tablet by mouth every 6 (six) hours as needed for up to 5 days for moderate pain.    predniSONE 10 MG tablet Commonly known as:  DELTASONE Take 1 tablet (10 mg total) by mouth daily. Label  & dispense according to the schedule below.  6 tablets day one, then 5 table day 2, then 4 tablets day 3, then 3 tablets day 4, 2 tablets day 5, then 1 tablet day 6, then stop Notes to patient:  Take as directed   sacubitril-valsartan 24-26 MG Commonly known as:  ENTRESTO Take 1 tablet by mouth 2 (two) times daily.       Today   Patient seen and evaluated today No chest pain No palpitations Tolerating diet well  VITAL SIGNS:  Blood pressure 114/62, pulse (!) 50, temperature 98 F (36.7 C), temperature source Oral, resp. rate (!) 21, height 5\' 6"  (1.676 m), weight (!) 139 kg, SpO2 97 %.  I/O:    Intake/Output Summary (Last 24 hours) at 10/04/2018 1347 Last data filed at 10/04/2018 0441 Gross per 24 hour  Intake -  Output 1300 ml  Net -1300 ml    PHYSICAL EXAMINATION:  Physical Exam  GENERAL:  57 y.o.-year-old patient lying in the bed with no acute distress.  LUNGS: Normal breath sounds bilaterally, no wheezing, rales,rhonchi or crepitation. No use of accessory muscles of respiration.  CARDIOVASCULAR: S1, S2 normal. No murmurs, rubs, or gallops.  ABDOMEN: Soft, non-tender, non-distended. Bowel sounds present. No organomegaly or mass.  NEUROLOGIC: Moves all 4 extremities. PSYCHIATRIC: The patient is alert and oriented x 3.  SKIN: No obvious rash, lesion, or ulcer.   DATA REVIEW:   CBC Recent Labs  Lab 09/30/18 0921 10/03/18 1725  WBC 8.8  --   HGB 14.8 14.6  HCT 46.1* 45.6  PLT 277  --     Chemistries  Recent Labs  Lab 09/30/18 0921 10/03/18 0452  NA 140 139  K 4.4 4.0  CL 105 105  CO2 25 27  GLUCOSE 125* 163*  BUN 9 16  CREATININE 0.73 0.68  CALCIUM 8.8* 8.3*  AST 21  --   ALT 14  --   ALKPHOS 69  --   BILITOT 1.1  --     Cardiac Enzymes Recent Labs  Lab 10/01/18 0509  TROPONINI <0.03    Microbiology Results  No results found for  this or any previous visit.  RADIOLOGY:  No results found.  Follow up with PCP in 1 week.  Management plans discussed with the patient, family and they are in agreement.  CODE STATUS: Full code    Code Status Orders  (From admission, onward)         Start     Ordered   09/30/18 1610  Full code  Continuous     09/30/18 1621        Code Status History    This patient has a current code status but no historical code status.      TOTAL TIME TAKING CARE OF THIS PATIENT ON DAY OF DISCHARGE: more than 33 minutes.   Ihor Austin M.D on 10/04/2018 at 1:47 PM  Between 7am to 6pm - Pager - [phone]  After 6pm go to [email or web address] -  password [email or web address] [facility]  [facility] [place] [facility]  Office  [phone]  CC: Primary care physician; Patient, No Pcp Per  Note: This dictation was prepared with Dragon dictation along with smaller phrase technology. Any transcriptional errors that result from this process are unintentional.

## 2018-10-13 ENCOUNTER — Other Ambulatory Visit: Payer: Self-pay

## 2018-10-13 ENCOUNTER — Emergency Department
Admission: EM | Admit: 2018-10-13 | Discharge: 2018-10-13 | Disposition: A | Discharge status: Home / Self Care | Payer: Self-pay | Source: Home / Self Care | Attending: Emergency Medicine | Admitting: Emergency Medicine

## 2018-10-13 ENCOUNTER — Encounter: Payer: Self-pay | Admitting: Emergency Medicine

## 2018-10-13 ENCOUNTER — Emergency Department
Admission: EM | Admit: 2018-10-13 | Discharge: 2018-10-13 | Disposition: A | Discharge status: Home / Self Care | Payer: Self-pay | Attending: Emergency Medicine | Admitting: Emergency Medicine

## 2018-10-13 ENCOUNTER — Emergency Department: Payer: Self-pay

## 2018-10-13 DIAGNOSIS — R600 Localized edema: Secondary | ICD-10-CM

## 2018-10-13 DIAGNOSIS — Z7901 Long term (current) use of anticoagulants: Secondary | ICD-10-CM | POA: Insufficient documentation

## 2018-10-13 DIAGNOSIS — M79605 Pain in left leg: Secondary | ICD-10-CM | POA: Insufficient documentation

## 2018-10-13 DIAGNOSIS — Z79899 Other long term (current) drug therapy: Secondary | ICD-10-CM | POA: Insufficient documentation

## 2018-10-13 DIAGNOSIS — B029 Zoster without complications: Secondary | ICD-10-CM

## 2018-10-13 DIAGNOSIS — T148XXA Other injury of unspecified body region, initial encounter: Secondary | ICD-10-CM

## 2018-10-13 DIAGNOSIS — F17210 Nicotine dependence, cigarettes, uncomplicated: Secondary | ICD-10-CM | POA: Insufficient documentation

## 2018-10-13 DIAGNOSIS — R609 Edema, unspecified: Secondary | ICD-10-CM

## 2018-10-13 MED ORDER — OXYCODONE-ACETAMINOPHEN 5-325 MG PO TABS
1.0000 | ORAL_TABLET | Freq: Once | ORAL | Status: AC
  Administered 2018-10-13: 1 via ORAL
  Filled 2018-10-13: qty 1

## 2018-10-13 MED ORDER — OXYCODONE-ACETAMINOPHEN 5-325 MG PO TABS: 1.0000 | ORAL_TABLET | Freq: Four times a day (QID) | ORAL | 0 refills | Status: DC | PRN

## 2018-10-13 NOTE — Discharge Instructions (Addendum)
Ultrasound the left lower extremity was negative for blood clot.  Advised to follow-up with treating doctor who ordered the ultrasound.  Follow discharge care instructions

## 2018-10-13 NOTE — ED Provider Notes (Signed)
[facility] Emergency Department Provider Note   ____________________________________________   None    (approximate)  I have reviewed the triage vital signs and the nursing notes.   HISTORY  Chief Complaint Leg Pain    HPI Caroline Rowe is a 57 y.o. female patient complain 5 days of nontraumatic left lower leg pain.  Patient denies fatigue, chest pain, or dyspnea.  Patient was recently released from the hospital secondary to a diagnosis of A. fib requiring cardioversion.  Patient states she notified her cardiologist of her leg pain and patient believe an ultrasound was performed.  Patient did not notice results of the ultrasound and I was unable to obtain them to a record search.  Patient also complaining of a rash right buttocks which she described as "painful".  Patient states the rash started 1 week ago and is now crusted over.  Patient state mild discomfort when laying supine secondary to the rash.  Patient rates the pain as a 2/10.  History reviewed. No pertinent past medical history.  Patient Active Problem List   Diagnosis Date Noted  . A-fib (HCC) 09/30/2018    Past Surgical History:  Procedure Laterality Date  . CARDIOVERSION N/A 10/03/2018   Procedure: CARDIOVERSION;  Surgeon: Laurier Nancy, MD;  Location: [facility] ORS;  Service: Cardiovascular;  Laterality: N/A;  . FOOT SURGERY    . TEE WITHOUT CARDIOVERSION N/A 10/03/2018   Procedure: TRANSESOPHAGEAL ECHOCARDIOGRAM (TEE);  Surgeon: Laurier Nancy, MD;  Location: [facility] ORS;  Service: Cardiovascular;  Laterality: N/A;    Prior to Admission medications   Medication Sig Start Date End Date Taking? Authorizing Provider  amiodarone (PACERONE) 400 MG tablet Take 1 tablet (400 mg total) by mouth 2 (two) times daily. 10/04/18 11/03/18  Ihor Austin, MD  apixaban (ELIQUIS) 5 MG TABS tablet Take 1 tablet (5 mg total) by mouth 2 (two) times daily. 10/04/18 11/03/18  Ihor Austin, MD  carvedilol  (COREG) 3.125 MG tablet Take 1 tablet (3.125 mg total) by mouth 2 (two) times daily with a meal. 10/04/18 11/03/18  Pyreddy, Vivien Rota, MD  chlorpheniramine-HYDROcodone (TUSSIONEX) 10-8 MG/5ML SUER Take 5 mLs by mouth every 12 (twelve) hours as needed for cough. 10/04/18   Ihor Austin, MD  oxyCODONE-acetaminophen (PERCOCET) 5-325 MG tablet Take 1 tablet by mouth every 6 (six) hours as needed for severe pain. 10/13/18 10/13/19  Joni Reining, PA-C  predniSONE (DELTASONE) 10 MG tablet Take 1 tablet (10 mg total) by mouth daily. Label  & dispense according to the schedule below.  6 tablets day one, then 5 table day 2, then 4 tablets day 3, then 3 tablets day 4, 2 tablets day 5, then 1 tablet day 6, then stop 10/04/18   Pyreddy, Vivien Rota, MD  sacubitril-valsartan (ENTRESTO) 24-26 MG Take 1 tablet by mouth 2 (two) times daily. 10/04/18 11/03/18  Ihor Austin, MD    Allergies Patient has no known allergies.  Family History  Problem Relation Age of Onset  . Heart disease Father     Social History Social History   Tobacco Use  . Smoking status: Current Every Day Smoker  . Smokeless tobacco: Never Used  Substance Use Topics  . Alcohol use: Never    Frequency: Never  . Drug use: Never    Review of Systems Constitutional: No fever/chills Eyes: No visual changes. ENT: No sore throat. Cardiovascular: Denies chest pain. Respiratory: Denies shortness of breath. Gastrointestinal: No abdominal pain.  No nausea, no vomiting.  No diarrhea.  No constipation. Genitourinary: Negative for dysuria. Musculoskeletal: Left lower leg pain. Skin: Rash on right buttocks. Neurological: Negative for headaches, focal weakness or numbness.  ____________________________________________   PHYSICAL EXAM:  VITAL SIGNS: ED Triage Vitals  Enc Vitals Group     BP 10/13/18 0844 (!) 160/88     Pulse Rate 10/13/18 0844 71     Resp 10/13/18 0844 18     Temp 10/13/18 0844 98 F (36.7 C)     Temp Source 10/13/18  0844 Oral     SpO2 10/13/18 0844 94 %     Weight 10/13/18 0838 (!) 304 lb 0.2 oz (137.9 kg)     Height 10/13/18 0838 5\' 5"  (1.651 m)     Head Circumference --      Peak Flow --      Pain Score 10/13/18 0838 2     Pain Loc --      Pain Edu? --      Excl. in GC? --     Constitutional: Alert and oriented. Well appearing and in no acute distress. Cardiovascular: Normal rate, regular rhythm. Grossly normal heart sounds.  Good peripheral circulation.  Elevated blood pressure Respiratory: Normal respiratory effort.  No retractions. Lungs CTAB. Gastrointestinal: Soft and nontender. No distention. No abdominal bruits. No CVA tenderness. Musculoskeletal: No lower extremity tenderness nor edema.  No joint effusions. Neurologic:  Normal speech and language. No gross focal neurologic deficits are appreciated. No gait instability. Skin:  Skin is warm, dry and intact. No rash noted. Psychiatric: Mood and affect are normal. Speech and behavior are normal.  ____________________________________________   LABS (all labs ordered are listed, but only abnormal results are displayed)  Labs Reviewed - No data to display ____________________________________________  EKG   ____________________________________________  RADIOLOGY  ED MD interpretation:    Official radiology report(s): US Venous Img Lower Unilateral Left  Result Date: 10/13/2018 CLINICAL DATA:  Left lower leg pain and edema. EXAM: LEFT LOWER EXTREMITY VENOUS DOPPLER ULTRASOUND TECHNIQUE: Gray-scale sonography with graded compression, as well as color Doppler and duplex ultrasound were performed to evaluate the lower extremity deep venous systems from the level of the common femoral vein and including the common femoral, femoral, profunda femoral, popliteal and calf veins including the posterior tibial, peroneal and gastrocnemius veins when visible. The superficial great saphenous vein was also interrogated. Spectral Doppler was utilized  to evaluate flow at rest and with distal augmentation maneuvers in the common femoral, femoral and popliteal veins. COMPARISON:  None. FINDINGS: Contralateral Common Femoral Vein: Respiratory phasicity is normal and symmetric with the symptomatic side. No evidence of thrombus. Normal compressibility. Common Femoral Vein: No evidence of thrombus. Normal compressibility, respiratory phasicity and response to augmentation. Saphenofemoral Junction: No evidence of thrombus. Normal compressibility and flow on color Doppler imaging. Profunda Femoral Vein: No evidence of thrombus. Normal compressibility and flow on color Doppler imaging. Femoral Vein: No evidence of thrombus. Normal compressibility, respiratory phasicity and response to augmentation. Popliteal Vein: No evidence of thrombus. Normal compressibility, respiratory phasicity and response to augmentation. Calf Veins: No evidence of thrombus. Normal compressibility and flow on color Doppler imaging. Superficial Great Saphenous Vein: No evidence of thrombus. Normal compressibility. Venous Reflux:  None. Other Findings: No evidence of superficial thrombophlebitis or abnormal fluid collection. IMPRESSION: No evidence of left lower extremity deep venous thrombosis. Electronically Signed   By: Irish Lack M.D.   On: 10/13/2018 10:39    ____________________________________________   PROCEDURES  Procedure(s) performed:   Procedures  Critical Care  performed:   ____________________________________________   INITIAL IMPRESSION / ASSESSMENT AND PLAN / ED COURSE  As part of my medical decision making, I reviewed the following data within the electronic MEDICAL RECORD NUMBER    Right leg pain secondary peripheral edema.  Resolving herpes zoster right buttocks.  Discussed ultrasound findings with patient.  Patient given discharge care instruction advised follow-up with cardiology.  Patient advised take medication as directed.       ____________________________________________   FINAL CLINICAL IMPRESSION(S) / ED DIAGNOSES  Final diagnoses:  Left leg pain  Peripheral edema  Herpes zoster without complication     ED Discharge Orders         Ordered    oxyCODONE-acetaminophen (PERCOCET) 5-325 MG tablet  Every 6 hours PRN     10/13/18 1057           Note:  This document was prepared using Dragon voice recognition software and may include unintentional dictation errors.    Joni Reining, PA-C 10/13/18 1103    Schaevitz, Myra Rude, MD 10/13/18 [phone]

## 2018-10-13 NOTE — ED Notes (Signed)
See triage note  Presents with intermittent pain to left lower leg  States her leg will feel numb at first   And then stinging type pain  Presents  Pain eases off with massage  No redness or swelling noted  Ambulates well  Pain is anterior   Also developed a rash to buttocks several days ago  States rash area was painful at the beginning   Large area of dried sore areas noted to buttocks

## 2018-10-13 NOTE — ED Triage Notes (Signed)
Pr was called back by provider for additional testing to left leg

## 2018-10-13 NOTE — ED Triage Notes (Signed)
Pt to ER c/o left lower leg stinging pain to front  And side of leg. States she was released from hospital recently and when she came home she had rash to lower back. States pain intermittent and alleviated with massage.  Pt alert and oriented X4, active, cooperative, pt in NAD. RR even and unlabored, color WNL.

## 2018-12-28 ENCOUNTER — Emergency Department: Payer: Self-pay

## 2018-12-28 ENCOUNTER — Other Ambulatory Visit: Payer: Self-pay

## 2018-12-28 ENCOUNTER — Emergency Department
Admission: EM | Admit: 2018-12-28 | Discharge: 2018-12-29 | Disposition: A | Discharge status: Home / Self Care | Payer: Self-pay | Attending: Emergency Medicine | Admitting: Emergency Medicine

## 2018-12-28 ENCOUNTER — Encounter: Payer: Self-pay | Admitting: Emergency Medicine

## 2018-12-28 DIAGNOSIS — Z79899 Other long term (current) drug therapy: Secondary | ICD-10-CM | POA: Insufficient documentation

## 2018-12-28 DIAGNOSIS — G629 Polyneuropathy, unspecified: Secondary | ICD-10-CM | POA: Insufficient documentation

## 2018-12-28 DIAGNOSIS — Z7901 Long term (current) use of anticoagulants: Secondary | ICD-10-CM | POA: Insufficient documentation

## 2018-12-28 DIAGNOSIS — F17210 Nicotine dependence, cigarettes, uncomplicated: Secondary | ICD-10-CM | POA: Insufficient documentation

## 2018-12-28 HISTORY — DX: Unspecified atrial fibrillation: I48.91

## 2018-12-28 LAB — COMPREHENSIVE METABOLIC PANEL
ALK PHOS: 79 U/L (ref 38–126)
ALT: 19 U/L (ref 0–44)
AST: 23 U/L (ref 15–41)
Albumin: 4 g/dL (ref 3.5–5.0)
Anion gap: 6 (ref 5–15)
BILIRUBIN TOTAL: 0.6 mg/dL (ref 0.3–1.2)
BUN: 12 mg/dL (ref 6–20)
CALCIUM: 8.7 mg/dL — AB (ref 8.9–10.3)
CO2: 29 mmol/L (ref 22–32)
CREATININE: 0.66 mg/dL (ref 0.44–1.00)
Chloride: 104 mmol/L (ref 98–111)
GFR calc Af Amer: >60 mL/min (ref >60)
Glucose, Bld: 86 mg/dL (ref 70–99)
Potassium: 3.7 mmol/L (ref 3.5–5.1)
SODIUM: 139 mmol/L (ref 135–145)
Total Protein: 7.8 g/dL (ref 6.5–8.1)

## 2018-12-28 LAB — DIFFERENTIAL
Abs Immature Granulocytes: 0.01 10*3/uL (ref 0.00–0.07)
Basophils Absolute: 0.1 10*3/uL (ref 0.0–0.1)
Basophils Relative: 1 %
Eosinophils Absolute: 0.2 10*3/uL (ref 0.0–0.5)
Eosinophils Relative: 2 %
Immature Granulocytes: 0 %
LYMPHS ABS: 2.7 10*3/uL (ref 0.7–4.0)
LYMPHS PCT: 40 %
MONO ABS: 0.7 10*3/uL (ref 0.1–1.0)
MONOS PCT: 11 %
Neutro Abs: 3 10*3/uL (ref 1.7–7.7)
Neutrophils Relative %: 46 %

## 2018-12-28 LAB — PROTIME-INR
INR: 1
Prothrombin Time: 13.1 seconds (ref 11.4–15.2)

## 2018-12-28 LAB — CBC
HEMATOCRIT: 44.2 % (ref 36.0–46.0)
HEMOGLOBIN: 14.7 g/dL (ref 12.0–15.0)
MCH: 29.8 pg (ref 26.0–34.0)
MCHC: 33.3 g/dL (ref 30.0–36.0)
MCV: 89.7 fL (ref 80.0–100.0)
Platelets: 278 10*3/uL (ref 150–400)
RBC: 4.93 MIL/uL (ref 3.87–5.11)
RDW: 13.9 % (ref 11.5–15.5)
WBC: 6.7 10*3/uL (ref 4.0–10.5)
nRBC: 0 % (ref 0.0–0.2)

## 2018-12-28 LAB — APTT: aPTT: 32 seconds (ref 24–36)

## 2018-12-28 LAB — TROPONIN I: Troponin I: <0.03 ng/mL (ref <0.03)

## 2018-12-28 MED ORDER — OXYCODONE-ACETAMINOPHEN 5-325 MG PO TABS
1.0000 | ORAL_TABLET | Freq: Once | ORAL | Status: AC
  Administered 2018-12-28: 1 via ORAL
  Filled 2018-12-28: qty 1

## 2018-12-28 MED ORDER — SODIUM CHLORIDE 0.9% FLUSH: 3.0000 mL | Freq: Once | INTRAVENOUS | Status: DC

## 2018-12-28 MED ORDER — OXYCODONE-ACETAMINOPHEN 5-325 MG PO TABS
1.0000 | ORAL_TABLET | Freq: Once | ORAL | Status: AC
  Administered 2018-12-29: 1 via ORAL
  Filled 2018-12-28: qty 1

## 2018-12-28 MED ORDER — OXYCODONE-ACETAMINOPHEN 5-325 MG PO TABS: 1.0000 | ORAL_TABLET | Freq: Four times a day (QID) | ORAL | 0 refills | Status: AC | PRN

## 2018-12-28 NOTE — ED Notes (Signed)
Patient transported to MRI 

## 2018-12-28 NOTE — ED Provider Notes (Signed)
[facility] Emergency Department Provider Note  ____________________________________________  Time seen: Approximately 11:18 PM  I have reviewed the triage vital signs and the nursing notes.   HISTORY  Chief Complaint Numbness    HPI Caroline Rowe is a 58 y.o. female with a history of atrial fibrillation, on Eliquis, who complains of  chronic left leg paresthesia for many months that started after an episode of shingles in the low back.  Over the past few weeks, she is now also having pain in the right leg, and today she had paresthesia of the left cheek.  She denies headaches motor weakness or falls.  No trauma.  Symptoms are constant, no aggravating or alleviating factors, moderate intensity.     Past Medical History:  Diagnosis Date  . A-fib [facility])      Patient Active Problem List   Diagnosis Date Noted  . A-fib (HCC) 09/30/2018     Past Surgical History:  Procedure Laterality Date  . CARDIOVERSION N/A 10/03/2018   Procedure: CARDIOVERSION;  Surgeon: Laurier Nancy, MD;  Location: [facility] ORS;  Service: Cardiovascular;  Laterality: N/A;  . FOOT SURGERY    . TEE WITHOUT CARDIOVERSION N/A 10/03/2018   Procedure: TRANSESOPHAGEAL ECHOCARDIOGRAM (TEE);  Surgeon: Laurier Nancy, MD;  Location: [facility] ORS;  Service: Cardiovascular;  Laterality: N/A;     Prior to Admission medications   Medication Sig Start Date End Date Taking? Authorizing Provider  acetaminophen (TYLENOL) 500 MG tablet Take 500-1,000 mg by mouth every 6 (six) hours as needed for mild pain.   Yes [provider]  apixaban (ELIQUIS) 5 MG TABS tablet Take 1 tablet (5 mg total) by mouth 2 (two) times daily. 10/04/18 12/28/18 Yes Pyreddy, Vivien Rota, MD  amiodarone (PACERONE) 400 MG tablet Take 1 tablet (400 mg total) by mouth 2 (two) times daily. 10/04/18 11/03/18  Ihor Austin, MD  carvedilol (COREG) 3.125 MG tablet Take 1 tablet (3.125 mg total) by mouth 2 (two) times daily with a  meal. 10/04/18 11/03/18  Pyreddy, Vivien Rota, MD  chlorpheniramine-HYDROcodone (TUSSIONEX) 10-8 MG/5ML SUER Take 5 mLs by mouth every 12 (twelve) hours as needed for cough. 10/04/18   Ihor Austin, MD  gabapentin (NEURONTIN) 300 MG capsule Take 300 mg by mouth 3 (three) times daily. 10/15/18   [provider]  oxyCODONE-acetaminophen (PERCOCET) 5-325 MG tablet Take 1 tablet by mouth every 6 (six) hours as needed for severe pain. 12/28/18 12/28/19  Sharman Cheek, MD  predniSONE (DELTASONE) 10 MG tablet Take 1 tablet (10 mg total) by mouth daily. Label  & dispense according to the schedule below.  6 tablets day one, then 5 table day 2, then 4 tablets day 3, then 3 tablets day 4, 2 tablets day 5, then 1 tablet day 6, then stop 10/04/18   Ihor Austin, MD     Allergies Patient has no known allergies.   Family History  Problem Relation Age of Onset  . Heart disease Father     Social History Social History   Tobacco Use  . Smoking status: Current Every Day Smoker  . Smokeless tobacco: Never Used  Substance Use Topics  . Alcohol use: Never    Frequency: Never  . Drug use: Never    Review of Systems  Constitutional:   No fever or chills.  ENT:   No sore throat. No rhinorrhea. Cardiovascular:   No chest pain or syncope. Respiratory:   No dyspnea or cough. Gastrointestinal:   Negative for abdominal pain, vomiting and  diarrhea.  Musculoskeletal:   Negative for focal pain or swelling All other systems reviewed and are negative except as documented above in ROS and HPI.  ____________________________________________   PHYSICAL EXAM:  VITAL SIGNS: ED Triage Vitals [12/28/18 1336]  Enc Vitals Group     BP (!) 173/87     Pulse Rate 72     Resp 16     Temp 98.5 F (36.9 C)     Temp Source Oral     SpO2 98 %     Weight 300 lb (136.1 kg)     Height 5\' 4"  (1.626 m)     Head Circumference      Peak Flow      Pain Score 6     Pain Loc      Pain Edu?      Excl. in GC?      Vital signs reviewed, nursing assessments reviewed.   Constitutional:   Alert and oriented. Non-toxic appearance. Eyes:   Conjunctivae are normal. EOMI. PERRL. ENT      Head:   Normocephalic and atraumatic.      Nose:   No congestion/rhinnorhea.       Mouth/Throat:   MMM, no pharyngeal erythema. No peritonsillar mass.       Neck:   No meningismus. Full ROM. Hematological/Lymphatic/Immunilogical:   No cervical lymphadenopathy. Cardiovascular:   RRR. Symmetric bilateral radial and DP pulses.  No murmurs. Cap refill less than 2 seconds. Respiratory:   Normal respiratory effort without tachypnea/retractions. Breath sounds are clear and equal bilaterally. No wheezes/rales/rhonchi. Gastrointestinal:   Soft and nontender. Non distended. There is no CVA tenderness.  No rebound, rigidity, or guarding. Musculoskeletal:   Normal range of motion in all extremities. No joint effusions.  No lower extremity tenderness.  No edema. Neurologic:   Normal speech and language.  Cranial nerves III through XII intact No pronator drift Steady gait NIH stroke scale 0 Motor grossly intact. No acute focal neurologic deficits are appreciated.  Skin:    Skin is warm, dry and intact. No rash noted.  No petechiae, purpura, or bullae.  ____________________________________________    LABS (pertinent positives/negatives) (all labs ordered are listed, but only abnormal results are displayed) Labs Reviewed  COMPREHENSIVE METABOLIC PANEL - Abnormal; Notable for the following components:      Result Value   Calcium 8.7 (*)    All other components within normal limits  PROTIME-INR  APTT  CBC  DIFFERENTIAL  TROPONIN I  CBG MONITORING, ED  POC URINE PREG, ED   ____________________________________________   EKG  Interpreted by me Sinus rhythm rate of 66, normal axis intervals QRS ST segments and T waves.  ____________________________________________    RADIOLOGY  Ct Head Wo Contrast  Result  Date: 12/28/2018 CLINICAL DATA:  Left lower extremity numbness.  Left face numbness. EXAM: CT HEAD WITHOUT CONTRAST TECHNIQUE: Contiguous axial images were obtained from the base of the skull through the vertex without intravenous contrast. COMPARISON:  None. FINDINGS: Brain: The ventricles are normal in size and configuration. There is no intracranial mass, hemorrhage, extra-axial fluid collection, or midline shift. The brain parenchyma appears unremarkable. No evident acute infarct. Vascular: No hyperdense vessel. There are foci of atherosclerotic calcification in each carotid siphon region. Skull: The bony calvarium appears intact. Sinuses/Orbits: Visualized paranasal sinuses are clear. Visualized orbits appear symmetric bilaterally. Other: Mastoid air cells are clear. IMPRESSION: Foci of arterial vascular calcification. Brain parenchyma appears unremarkable. No mass or hemorrhage. Electronically Signed   By:  Bretta BangWilliam  Woodruff III M.D.   On: 12/28/2018 13:59   Mr Brain Wo Contrast  Result Date: 12/28/2018 CLINICAL DATA:  58 y/o F; right lower leg numbness and pain. Left-sided facial numbness. EXAM: MRI HEAD WITHOUT CONTRAST TECHNIQUE: Multiplanar, multiecho pulse sequences of the brain and surrounding structures were obtained without intravenous contrast. COMPARISON:  12/28/2018 CT head. FINDINGS: Brain: No acute infarction, hemorrhage, hydrocephalus, extra-axial collection or mass lesion. Few punctate nonspecific foci of T2 FLAIR hyperintensity in white matter compatible minimal chronic microvascular ischemic changes for age. Vascular: Normal flow voids. Skull and upper cervical spine: Normal marrow signal. Sinuses/Orbits: Negative. Other: None. IMPRESSION: No acute intracranial abnormality identified. Unremarkable MRI of the brain for age. Electronically Signed   By: Mitzi HansenLance  Furusawa-Stratton M.D.   On: 12/28/2018 22:23     ____________________________________________   PROCEDURES Procedures  ____________________________________________  DIFFERENTIAL DIAGNOSIS   Peripheral neuropathy, postherpetic neuralgia, multiple sclerosis, ischemic stroke  CLINICAL IMPRESSION / ASSESSMENT AND PLAN / ED COURSE  Pertinent labs & imaging results that were available during my care of the patient were reviewed by me and considered in my medical decision making (see chart for details).    Patient presents with multiple neurologic complaints, most likely due to chronic neuropathy, may be worsened by discontinuing her gabapentin and a subtle withdrawal syndrome.  CT head unremarkable, MRI brain unremarkable.  Stroke scale 0 for me.  Stable to discharge home.  Encouraged her to continue her Eliquis with her A. fib history and follow-up with neurology.      ____________________________________________   FINAL CLINICAL IMPRESSION(S) / ED DIAGNOSES    Final diagnoses:  Neuropathy     ED Discharge Orders         Ordered    oxyCODONE-acetaminophen (PERCOCET) 5-325 MG tablet  Every 6 hours PRN     12/28/18 2317          Portions of this note were generated with dragon dictation software. Dictation errors may occur despite best attempts at proofreading.   Sharman CheekStafford, Bryson Gavia, MD 12/28/18 2328

## 2018-12-28 NOTE — ED Notes (Signed)
ED Provider at bedside. 

## 2018-12-28 NOTE — ED Notes (Signed)
Pt requesting that blood pressure cuff remain off at this time. This RN notified MD of pt request.

## 2018-12-28 NOTE — ED Triage Notes (Addendum)
States had Shingles a few months ago.  Has had residual pain to legs, which she was seen through [facility] and started on Neurontin.  States pain stopped and left leg became numb mid shin to ankle (for several months).  A few weeks ago, began feeling pain to left leg to ankle - anterior side.  A few days ago, began having intermittent numbness to left cheek.  States numbness to left cheek was noticed this morning when she awoke.  Went to bed last night at around midnight.  AAOx3.  Skin warm and dry. MAE equally and strong.  Left facial droop noted.  Sensation less to left cheek and left leg.

## 2018-12-28 NOTE — ED Notes (Signed)
Reference triage note. Pt c/o right lower leg pain at this time. Pt states that left leg is numb at this time from the shin down. 2+ bilateral pedal pulses noted by the RN. Pt also c/o left sided facial numbness. Pt states this has been going on since last night. Pt tearful in bed when this RN walked into room. This RN will continue to monitor.

## 2018-12-28 NOTE — ED Notes (Signed)
MRI informed this RN that pt's scan would be performed around 9pm.

## 2018-12-29 NOTE — ED Notes (Signed)
Pt given meal tray. MD aware.  

## 2020-01-23 IMAGING — US US EXTREM LOW VENOUS*L*
1 series · 13 of 24 positions shown · non-contrast
Comparison: None.

CLINICAL DATA: Left lower leg pain and edema.



[Series 1: us extrem low venous*left* · 0.09mm/px · 13 of 32 slices shown]
[im 1/32]
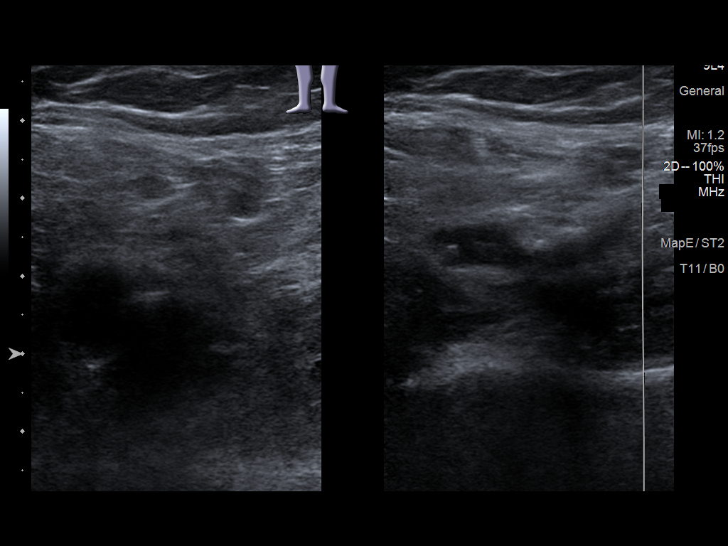
[im 3/32]
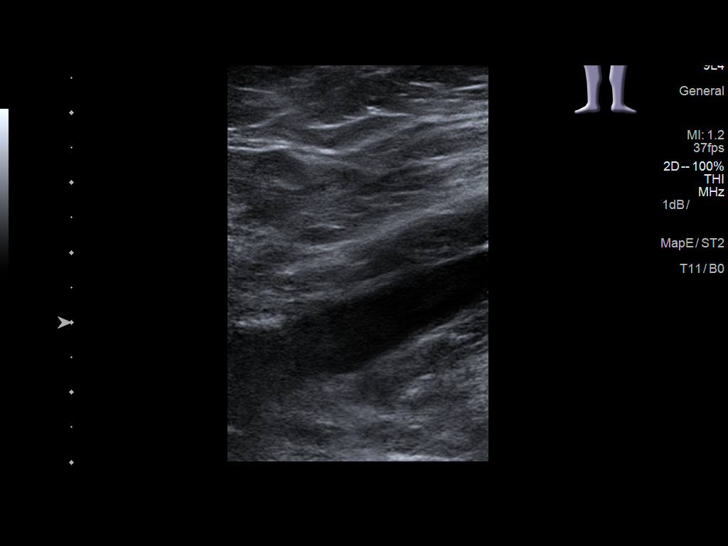
[im 6/32]
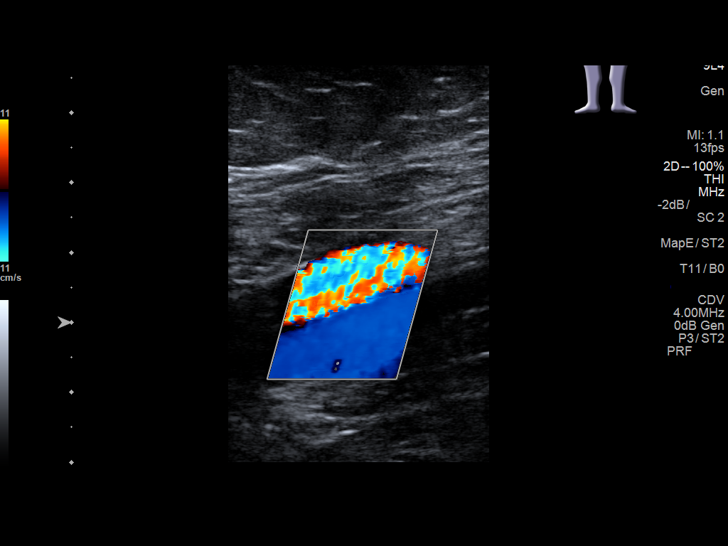
[im 9/32]
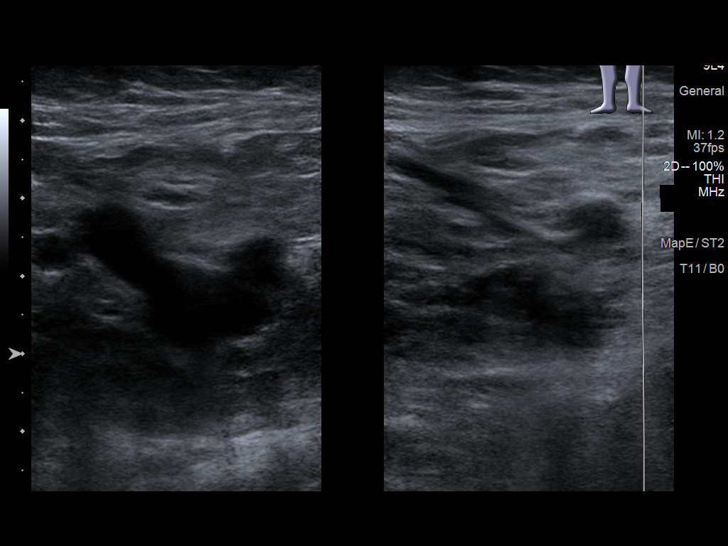
[im 11/32]
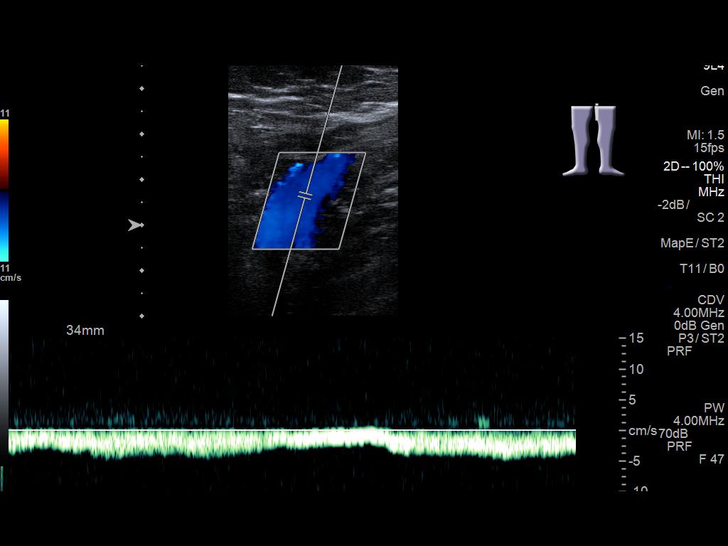
[im 14/32]
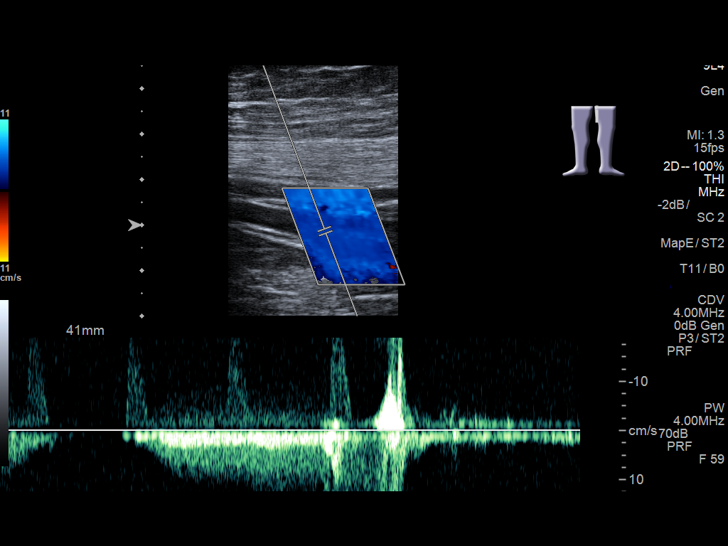
[im 17/32]
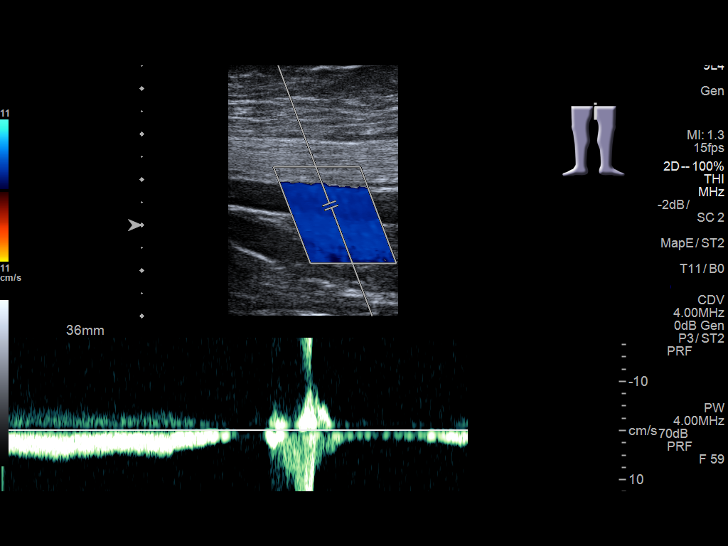
[im 18/32]
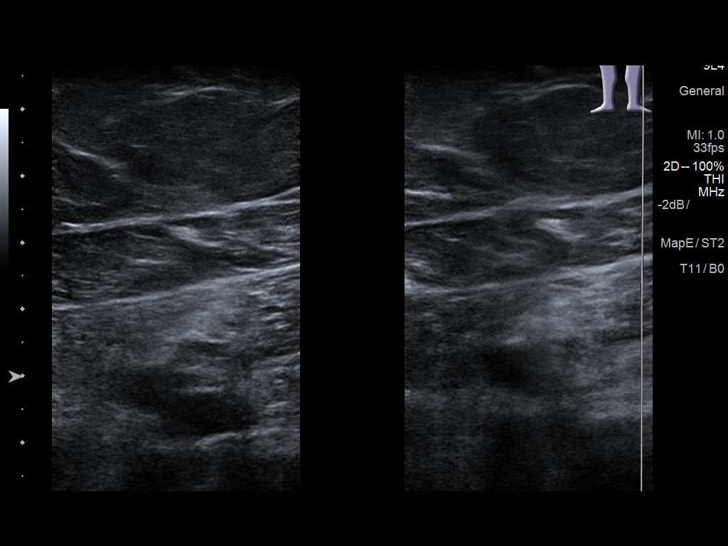
[im 21/32]
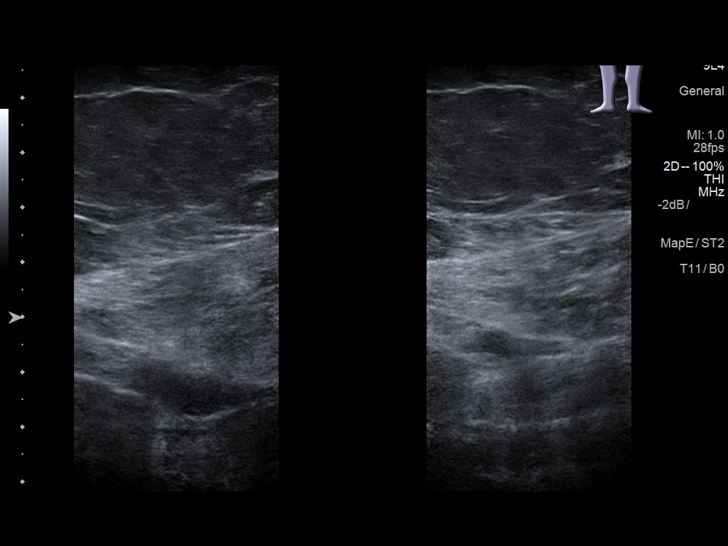
[im 23/32]
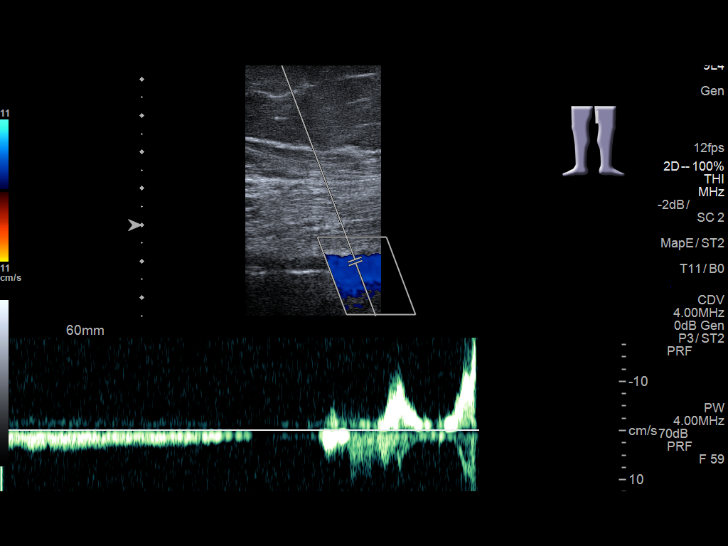
[im 26/32]
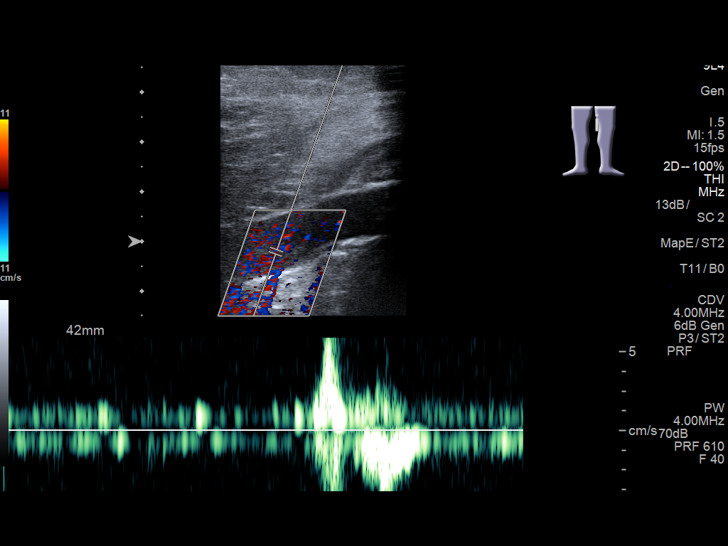
[im 29/32]
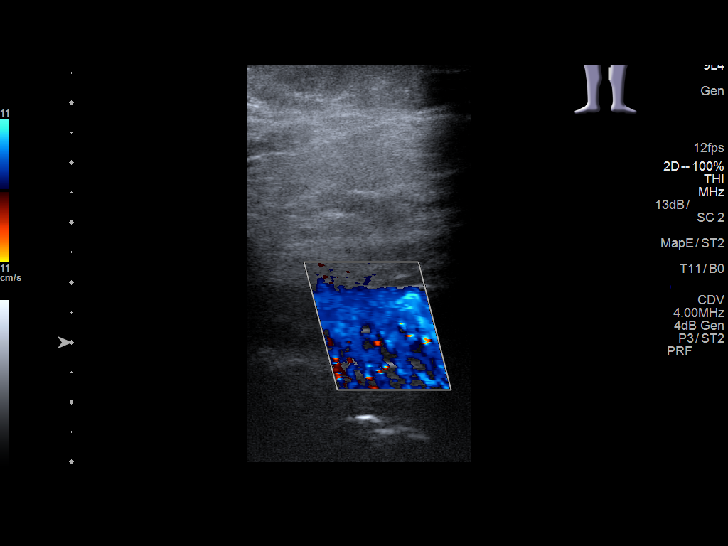
[im 32/32]
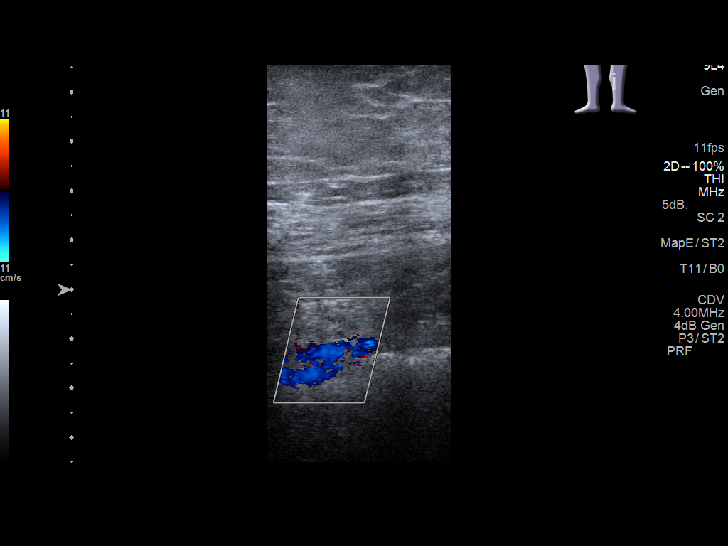

[13 of 24 positions shown; findings below may reference images not displayed]

FINDINGS: Contralateral Common Femoral Vein: Respiratory phasicity is normal
and symmetric with the symptomatic side. No evidence of thrombus.
Normal compressibility.

Common Femoral Vein: No evidence of thrombus. Normal
compressibility, respiratory phasicity and response to augmentation.

Saphenofemoral Junction: No evidence of thrombus. Normal
compressibility and flow on color Doppler imaging.

Profunda Femoral Vein: No evidence of thrombus. Normal
compressibility and flow on color Doppler imaging.

Femoral Vein: No evidence of thrombus. Normal compressibility,
respiratory phasicity and response to augmentation.

Popliteal Vein: No evidence of thrombus. Normal compressibility,
respiratory phasicity and response to augmentation.

Calf Veins: No evidence of thrombus. Normal compressibility and flow
on color Doppler imaging.

Superficial Great Saphenous Vein: No evidence of thrombus. Normal
compressibility.

Venous Reflux:  None.

Other Findings: No evidence of superficial thrombophlebitis or
abnormal fluid collection.
IMPRESSION: No evidence of left lower extremity deep venous thrombosis.

## 2020-06-20 ENCOUNTER — Other Ambulatory Visit: Payer: Self-pay

## 2020-06-20 ENCOUNTER — Emergency Department
Admission: EM | Admit: 2020-06-20 | Discharge: 2020-06-20 | Disposition: A | Discharge status: Home / Self Care | Payer: BLUE CROSS/BLUE SHIELD | Attending: Emergency Medicine | Admitting: Emergency Medicine

## 2020-06-20 DIAGNOSIS — Z5321 Procedure and treatment not carried out due to patient leaving prior to being seen by health care provider: Secondary | ICD-10-CM | POA: Diagnosis not present

## 2020-06-20 DIAGNOSIS — R110 Nausea: Secondary | ICD-10-CM | POA: Insufficient documentation

## 2020-06-20 DIAGNOSIS — R42 Dizziness and giddiness: Secondary | ICD-10-CM | POA: Diagnosis present

## 2020-06-20 LAB — URINALYSIS, COMPLETE (UACMP) WITH MICROSCOPIC
Bilirubin Urine: NEGATIVE
Glucose, UA: NEGATIVE mg/dL
Ketones, ur: NEGATIVE mg/dL
Leukocytes,Ua: NEGATIVE
Nitrite: NEGATIVE
Protein, ur: NEGATIVE mg/dL
Specific Gravity, Urine: 1.014 (ref 1.005–1.030)
pH: 6 (ref 5.0–8.0)

## 2020-06-20 LAB — BASIC METABOLIC PANEL
Anion gap: 10 (ref 5–15)
BUN: 8 mg/dL (ref 6–20)
CO2: 23 mmol/L (ref 22–32)
Calcium: 8.6 mg/dL — ABNORMAL LOW (ref 8.9–10.3)
Chloride: 105 mmol/L (ref 98–111)
Creatinine, Ser: 0.79 mg/dL (ref 0.44–1.00)
GFR calc Af Amer: >60 mL/min (ref >60)
GFR calc non Af Amer: >60 mL/min (ref >60)
Glucose, Bld: 110 mg/dL — ABNORMAL HIGH (ref 70–99)
Potassium: 3.8 mmol/L (ref 3.5–5.1)
Sodium: 138 mmol/L (ref 135–145)

## 2020-06-20 LAB — CBC
HCT: 46.6 % — ABNORMAL HIGH (ref 36.0–46.0)
Hemoglobin: 16 g/dL — ABNORMAL HIGH (ref 12.0–15.0)
MCH: 29.9 pg (ref 26.0–34.0)
MCHC: 34.3 g/dL (ref 30.0–36.0)
MCV: 87.1 fL (ref 80.0–100.0)
Platelets: 315 10*3/uL (ref 150–400)
RBC: 5.35 MIL/uL — ABNORMAL HIGH (ref 3.87–5.11)
RDW: 13.2 % (ref 11.5–15.5)
WBC: 8.7 10*3/uL (ref 4.0–10.5)
nRBC: 0 % (ref 0.0–0.2)

## 2020-06-20 LAB — TROPONIN I (HIGH SENSITIVITY): Troponin I (High Sensitivity): 2 ng/L (ref <18)

## 2020-06-20 MED ORDER — SODIUM CHLORIDE 0.9% FLUSH: 3.0000 mL | Freq: Once | INTRAVENOUS | Status: DC

## 2020-06-20 NOTE — ED Notes (Signed)
Pt upset at wait and requested her d/c from [facility]. Encouraged pt to call MD and follow up or return for worsening sx's.

## 2020-06-20 NOTE — ED Triage Notes (Signed)
Pt states that she was cardioverted out of a.fib last Tuesday, states felt well yesterday, today started feeling hot from the waist up that was a quick come and go sensation, felt dizzy, nauseated, pt went to [facility] for an ekg and was noted that the patient was back in a.fib and sent to the ER

## 2020-06-20 NOTE — ED Notes (Signed)
Pt up to counter requesting her d/c paperwork from [facility] and stating that she is leaving. Encouraged pt to stay. Pt upset due to wait time. RN apologetic to pt. Pt still refuses to stay

## 2020-06-23 ENCOUNTER — Telehealth: Payer: Self-pay | Admitting: Emergency Medicine

## 2020-06-23 NOTE — Telephone Encounter (Signed)
Called patient due to lwot to inquire about condition and follow up plans.  She says she is still in a fib.  Rate 70s to 80s.  Says they did the shock about 2 weeks ago to get her out of it.  I told her to call her cardiologist today to see what the next steps are.  Says she will be calling them.

## 2022-07-14 ENCOUNTER — Other Ambulatory Visit: Payer: Self-pay | Admitting: Family Medicine

## 2022-07-14 DIAGNOSIS — Z1231 Encounter for screening mammogram for malignant neoplasm of breast: Secondary | ICD-10-CM

## 2023-08-18 LAB — EXTERNAL GENERIC LAB PROCEDURE: COLOGUARD: NEGATIVE
